# Patient Record
Sex: Male | Born: 1980 | Race: White | Hispanic: No | Marital: Married | Smoking: Former smoker
Health system: Southern US, Community
[De-identification: ages and names within clinical notes are randomized; demographics above are authoritative.]

## PROBLEM LIST (undated history)

## (undated) DIAGNOSIS — E669 Obesity, unspecified: Secondary | ICD-10-CM

## (undated) DIAGNOSIS — I4891 Unspecified atrial fibrillation: Secondary | ICD-10-CM

## (undated) DIAGNOSIS — R030 Elevated blood-pressure reading, without diagnosis of hypertension: Secondary | ICD-10-CM

## (undated) HISTORY — DX: Elevated blood-pressure reading, without diagnosis of hypertension: R03.0

## (undated) HISTORY — PX: ANTERIOR CRUCIATE LIGAMENT REPAIR: SHX115

## (undated) HISTORY — DX: Obesity, unspecified: E66.9

## (undated) HISTORY — PX: KNEE SURGERY: SHX244

## (undated) HISTORY — DX: Unspecified atrial fibrillation: I48.91

---

## 2014-05-27 ENCOUNTER — Ambulatory Visit: Payer: Self-pay | Admitting: Specialist

## 2014-06-16 ENCOUNTER — Encounter: Payer: Self-pay | Admitting: Specialist

## 2014-06-21 ENCOUNTER — Encounter
Admit: 2014-06-21 | Disposition: A | Discharge status: Home / Self Care | Payer: Self-pay | Attending: Specialist | Admitting: Specialist

## 2014-07-22 ENCOUNTER — Encounter
Admit: 2014-07-22 | Disposition: A | Discharge status: Home / Self Care | Payer: Self-pay | Attending: Specialist | Admitting: Specialist

## 2015-01-28 ENCOUNTER — Emergency Department
Admission: EM | Admit: 2015-01-28 | Discharge: 2015-01-28 | Disposition: A | Discharge status: Home / Self Care | Payer: No Typology Code available for payment source | Attending: Emergency Medicine | Admitting: Emergency Medicine

## 2015-01-28 ENCOUNTER — Emergency Department: Payer: No Typology Code available for payment source

## 2015-01-28 ENCOUNTER — Encounter: Payer: Self-pay | Admitting: Emergency Medicine

## 2015-01-28 DIAGNOSIS — S161XXA Strain of muscle, fascia and tendon at neck level, initial encounter: Secondary | ICD-10-CM | POA: Insufficient documentation

## 2015-01-28 DIAGNOSIS — S46812A Strain of other muscles, fascia and tendons at shoulder and upper arm level, left arm, initial encounter: Secondary | ICD-10-CM | POA: Insufficient documentation

## 2015-01-28 DIAGNOSIS — S199XXA Unspecified injury of neck, initial encounter: Secondary | ICD-10-CM | POA: Diagnosis present

## 2015-01-28 DIAGNOSIS — S39012A Strain of muscle, fascia and tendon of lower back, initial encounter: Secondary | ICD-10-CM | POA: Diagnosis not present

## 2015-01-28 DIAGNOSIS — Y9389 Activity, other specified: Secondary | ICD-10-CM | POA: Diagnosis not present

## 2015-01-28 DIAGNOSIS — Y998 Other external cause status: Secondary | ICD-10-CM | POA: Diagnosis not present

## 2015-01-28 DIAGNOSIS — Y92410 Unspecified street and highway as the place of occurrence of the external cause: Secondary | ICD-10-CM | POA: Diagnosis not present

## 2015-01-28 MED ORDER — IBUPROFEN 800 MG PO TABS: 800.0000 mg | ORAL_TABLET | Freq: Three times a day (TID) | ORAL | Status: DC | PRN

## 2015-01-28 MED ORDER — CYCLOBENZAPRINE HCL 10 MG PO TABS: 10.0000 mg | ORAL_TABLET | Freq: Three times a day (TID) | ORAL | Status: DC | PRN
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
## 2015-03-06 ENCOUNTER — Ambulatory Visit: Payer: Self-pay | Admitting: Physician Assistant

## 2015-03-06 ENCOUNTER — Encounter: Payer: Self-pay | Admitting: Physician Assistant

## 2015-03-06 VITALS — BP 147/94 | HR 81 | Temp 98.6°F

## 2015-03-06 DIAGNOSIS — J069 Acute upper respiratory infection, unspecified: Secondary | ICD-10-CM

## 2015-03-06 MED ORDER — AZITHROMYCIN 250 MG PO TABS: ORAL_TABLET | ORAL | Status: DC

## 2015-03-06 MED ORDER — ALBUTEROL SULFATE HFA 108 (90 BASE) MCG/ACT IN AERS: 2.0000 | INHALATION_SPRAY | Freq: Four times a day (QID) | RESPIRATORY_TRACT | Status: DC | PRN

## 2015-03-06 MED ORDER — FLUTICASONE PROPIONATE 50 MCG/ACT NA SUSP: 2.0000 | Freq: Every day | NASAL | Status: DC

## 2015-03-06 NOTE — Progress Notes (Signed)
S: C/o runny nose and congestion for 3 days, no fever, chills, cp/sob, v/d; mucus is yellow and thick, cough is sporadic, c/o of facial and dental pain.   Using otc meds:   O: PE: vitals wnl, nad, perrl eomi, normocephalic, tms dull, nasal mucosa red and swollen, throat injected, neck supple no lymph, lungs c t a, cv rrr, neuro intact  A:  Acute sinusitis   P: zpack, flonase, albuterol refill drink fluids, continue regular meds , use otc meds of choice, return if not improving in 5 days, return earlier if worsening

## 2015-04-16 ENCOUNTER — Emergency Department
Admission: EM | Admit: 2015-04-16 | Discharge: 2015-04-16 | Disposition: A | Discharge status: Home / Self Care | Payer: Worker's Compensation | Attending: Emergency Medicine | Admitting: Emergency Medicine

## 2015-04-16 ENCOUNTER — Emergency Department: Payer: Worker's Compensation

## 2015-04-16 ENCOUNTER — Encounter: Payer: Self-pay | Admitting: Emergency Medicine

## 2015-04-16 DIAGNOSIS — Y990 Civilian activity done for income or pay: Secondary | ICD-10-CM | POA: Insufficient documentation

## 2015-04-16 DIAGNOSIS — Z880 Allergy status to penicillin: Secondary | ICD-10-CM | POA: Insufficient documentation

## 2015-04-16 DIAGNOSIS — Z79899 Other long term (current) drug therapy: Secondary | ICD-10-CM | POA: Diagnosis not present

## 2015-04-16 DIAGNOSIS — S6991XA Unspecified injury of right wrist, hand and finger(s), initial encounter: Secondary | ICD-10-CM | POA: Diagnosis present

## 2015-04-16 DIAGNOSIS — Z792 Long term (current) use of antibiotics: Secondary | ICD-10-CM | POA: Diagnosis not present

## 2015-04-16 DIAGNOSIS — Y9389 Activity, other specified: Secondary | ICD-10-CM | POA: Diagnosis not present

## 2015-04-16 DIAGNOSIS — S62390A Other fracture of second metacarpal bone, right hand, initial encounter for closed fracture: Secondary | ICD-10-CM | POA: Diagnosis not present

## 2015-04-16 DIAGNOSIS — S62300A Unspecified fracture of second metacarpal bone, right hand, initial encounter for closed fracture: Secondary | ICD-10-CM

## 2015-04-16 DIAGNOSIS — Z87891 Personal history of nicotine dependence: Secondary | ICD-10-CM | POA: Insufficient documentation

## 2015-04-16 DIAGNOSIS — Y9289 Other specified places as the place of occurrence of the external cause: Secondary | ICD-10-CM | POA: Diagnosis not present

## 2015-04-16 MED ORDER — IBUPROFEN 800 MG PO TABS
800.0000 mg | ORAL_TABLET | Freq: Once | ORAL | Status: AC
  Administered 2015-04-16: 800 mg via ORAL
  Filled 2015-04-16: qty 1

## 2015-04-16 MED ORDER — DOCUSATE SODIUM 100 MG PO CAPS: ORAL_CAPSULE | ORAL | Status: DC

## 2015-04-16 MED ORDER — HYDROCODONE-ACETAMINOPHEN 5-325 MG PO TABS: 1.0000 | ORAL_TABLET | ORAL | Status: DC | PRN

## 2015-04-16 MED ORDER — ACETAMINOPHEN 500 MG PO TABS
1000.0000 mg | ORAL_TABLET | Freq: Once | ORAL | Status: AC
  Administered 2015-04-16: 1000 mg via ORAL
  Filled 2015-04-16: qty 2
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
## 2015-04-16 NOTE — Discharge Instructions (Signed)
Metacarpal Fracture °A metacarpal fracture is a break (fracture) of a bone in the hand. Metacarpals are the bones that extend from your knuckles to your wrist. In each hand, you have five metacarpal bones that connect your fingers and your thumb to your wrist. °Some hand fractures have bone pieces that are close together and stable (simple). These fractures may be treated with only a splint or cast. Hand fractures that have many pieces of broken bone (comminuted), unstable bone pieces (displaced), or a bone that breaks through the skin (compound) usually require surgery. °CAUSES °This injury may be caused by: °· A fall. °· A hard, direct hit to your hand. °· An injury that squeezes your knuckle, stretches your finger out of place, or crushes your hand. °RISK FACTORS °This injury is more likely to occur if: °· You play contact sports. °· You have certain bone diseases. °SYMPTOMS  °Symptoms of this type of fracture develop soon after the injury. Symptoms may include: °· Swelling. °· Pain. °· Stiffness. °· Increased pain with movement. °· Bruising. °· Inability to move a finger. °· A shortened finger. °· A finger knuckle that looks sunken in. °· Unusual appearance of the hand or finger (deformity). °DIAGNOSIS  °This injury may be diagnosed based on your signs and symptoms, especially if you had a recent hand injury. Your health care provider will perform a physical exam. He or she may also order X-rays to confirm the diagnosis.  °TREATMENT  °Treatment for this injury depends on the type of fracture you have and how severe it is. Possible treatments include: °· Non-reduction. This can be done if the bone does not need to be moved back into place. The fracture can be casted or splinted as it is.   °· Closed reduction. If your bone is stable and can be moved back into place, you may only need to wear a cast or splint or have buddy taping. °· Closed reduction with internal fixation (CRIF). This is the most common  treatment. You may have this procedure if your bone can be moved back into place but needs more support. Wires, pins, or screws may be inserted through your skin to stabilize the fracture. °· Open reduction with internal fixation (ORIF). This may be needed if your fracture is severe and unstable. It involves surgery to move your bone back into the right position. Screws, wires, or plates are used to stabilize the fracture. °After all procedures, you may need to wear a cast or a splint for several weeks. You will also need to have follow-up X-rays to make sure that the bone is healing well and staying in position. After you no longer need your cast or splint, you may need physical therapy. This will help you to regain full movement and strength in your hand.  °HOME CARE INSTRUCTIONS  °If You Have a Cast: °· Do not stick anything inside the cast to scratch your skin. Doing that increases your risk of infection. °· Check the skin around the cast every day. Report any concerns to your health care provider. You may put lotion on dry skin around the edges of the cast. Do not apply lotion to the skin underneath the cast. °If You Have a Splint: °· Wear it as directed by your health care provider. Remove it only as directed by your health care provider. °· Loosen the splint if your fingers become numb and tingle, or if they turn cold and blue. °Bathing °· Cover the cast or splint with a   watertight plastic bag to protect it from water while you take a bath or a shower. Do not let the cast or splint get wet. °Managing Pain, Stiffness, and Swelling °· If directed, apply ice to the injured area (if you have a splint, not a cast): °¨ Put ice in a plastic bag. °¨ Place a towel between your skin and the bag. °¨ Leave the ice on for 20 minutes, 2-3 times a day. °· Move your fingers often to avoid stiffness and to lessen swelling. °· Raise the injured area above the level of your heart while you are sitting or lying  down. °Driving °· Do not drive or operate heavy machinery while taking pain medicine. °· Do not drive while wearing a cast or splint on a hand that you use for driving. °Activity °· Return to your normal activities as directed by your health care provider. Ask your health care provider what activities are safe for you. °General Instructions °· Do not put pressure on any part of the cast or splint until it is fully hardened. This may take several hours. °· Keep the cast or splint clean and dry. °· Do not use any tobacco products, including cigarettes, chewing tobacco, or electronic cigarettes. Tobacco can delay bone healing. If you need help quitting, ask your health care provider. °· Take medicines only as directed by your health care provider. °· Keep all follow-up visits as directed by your health care provider. This is important. °SEEK MEDICAL CARE IF:  °· Your pain is getting worse. °· You have redness, swelling, or pain in the injured area.   °· You have fluid, blood, or pus coming from under your cast or splint.   °· You notice a bad smell coming from under your cast or splint.   °· You have a fever.   °SEEK IMMEDIATE MEDICAL CARE IF:  °· You develop a rash.   °· You have trouble breathing.   °· Your skin or nails on your injured hand turn blue or gray even after you loosen your splint. °· Your injured hand feels cold or becomes numb even after you loosen your splint.   °· You develop severe pain under the cast or in your hand. °  °This information is not intended to replace advice given to you by your health care provider. Make sure you discuss any questions you have with your health care provider. °  °Document Released: 04/08/2005 Document Revised: 12/28/2014 Document Reviewed: 01/26/2014 °Elsevier Interactive Patient Education ©2016 [employer] ° °Cast or Splint Care °Casts and splints support injured limbs and keep bones from moving while they heal. It is important to care for your cast or splint at  home.   °HOME CARE INSTRUCTIONS °· Keep the cast or splint uncovered during the drying period. It can take 24 to 48 hours to dry if it is made of plaster. A fiberglass cast will dry in less than 1 hour. °· Do not rest the cast on anything harder than a pillow for the first 24 hours. °· Do not put weight on your injured limb or apply pressure to the cast until your health care provider gives you permission. °· Keep the cast or splint dry. Wet casts or splints can lose their shape and may not support the limb as well. A wet cast that has lost its shape can also create harmful pressure on your skin when it dries. Also, wet skin can become infected. °¨ Cover the cast or splint with a plastic bag when bathing or when out in   the rain or snow. If the cast is on the trunk of the body, take sponge baths until the cast is removed. °¨ If your cast does become wet, dry it with a towel or a blow dryer on the cool setting only. °· Keep your cast or splint clean. Soiled casts may be wiped with a moistened cloth. °· Do not place any hard or soft foreign objects under your cast or splint, such as cotton, toilet paper, lotion, or powder. °· Do not try to scratch the skin under the cast with any object. The object could get stuck inside the cast. Also, scratching could lead to an infection. If itching is a problem, use a blow dryer on a cool setting to relieve discomfort. °· Do not trim or cut your cast or remove padding from inside of it. °· Exercise all joints next to the injury that are not immobilized by the cast or splint. For example, if you have a long leg cast, exercise the hip joint and toes. If you have an arm cast or splint, exercise the shoulder, elbow, thumb, and fingers. °· Elevate your injured arm or leg on 1 or 2 pillows for the first 1 to 3 days to decrease swelling and pain. It is best if you can comfortably elevate your cast so it is higher than your heart. °SEEK MEDICAL CARE IF:  °· Your cast or splint  cracks. °· Your cast or splint is too tight or too loose. °· You have unbearable itching inside the cast. °· Your cast becomes wet or develops a soft spot or area. °· You have a bad smell coming from inside your cast. °· You get an object stuck under your cast. °· Your skin around the cast becomes red or raw. °· You have new pain or worsening pain after the cast has been applied. °SEEK IMMEDIATE MEDICAL CARE IF:  °· You have fluid leaking through the cast. °· You are unable to move your fingers or toes. °· You have discolored (blue or white), cool, painful, or very swollen fingers or toes beyond the cast. °· You have tingling or numbness around the injured area. °· You have severe pain or pressure under the cast. °· You have any difficulty with your breathing or have shortness of breath. °· You have chest pain. °  °This information is not intended to replace advice given to you by your health care provider. Make sure you discuss any questions you have with your health care provider. °  °Document Released: 04/05/2000 Document Revised: 01/27/2013 Document Reviewed: 10/15/2012 °Elsevier Interactive Patient Education ©2016 [employer] ° °

## 2015-07-03 ENCOUNTER — Encounter (HOSPITAL_COMMUNITY): Payer: Self-pay | Admitting: Emergency Medicine

## 2015-07-03 ENCOUNTER — Emergency Department (HOSPITAL_COMMUNITY)
Admission: EM | Admit: 2015-07-03 | Discharge: 2015-07-03 | Disposition: A | Discharge status: Home / Self Care | Payer: Managed Care, Other (non HMO) | Source: Home / Self Care | Attending: Emergency Medicine | Admitting: Emergency Medicine

## 2015-07-03 DIAGNOSIS — L237 Allergic contact dermatitis due to plants, except food: Secondary | ICD-10-CM

## 2015-07-03 DIAGNOSIS — L03311 Cellulitis of abdominal wall: Secondary | ICD-10-CM | POA: Diagnosis not present

## 2015-07-03 MED ORDER — CEPHALEXIN 500 MG PO CAPS: 500.0000 mg | ORAL_CAPSULE | Freq: Four times a day (QID) | ORAL | Status: DC

## 2015-07-03 NOTE — ED Provider Notes (Signed)
CSN: [id number]     Arrival date & time 07/03/15  1911 History   First MD Initiated Contact with Patient 07/03/15 2013     Chief Complaint  Patient presents with  . Poison Ivy   (Consider location/radiation/quality/duration/timing/severity/associated sxs/prior Treatment) HPI  He is a 35 year old man here for evaluation of skin infection. He states he developed poison ivy in his left lower abdomen and groin area about a week ago. He has been using alcohol and over-the-counter cortisone cream and this has been improving. In the last day or 2 he has noticed 2 spots just superior to the poison ivy that have become more red and tender. He states it feels a little warm. He denies any drainage. He denies any known injury or bug bites. No fevers.  History reviewed. No pertinent past medical history. Past Surgical History  Procedure Laterality Date  . Knee surgery Left    History reviewed. No pertinent family history. Social History  Substance Use Topics  . Smoking status: Former Games developer  . Smokeless tobacco: Never Used  . Alcohol Use: Yes    Review of Systems As in history of present illness Allergies  Penicillin g  Home Medications   Prior to Admission medications   Medication Sig Start Date End Date Taking? Authorizing Provider  albuterol (PROAIR HFA) 108 (90 BASE) MCG/ACT inhaler Inhale 2 puffs into the lungs every 6 (six) hours as needed for wheezing or shortness of breath. 03/06/15   Faythe Ghee, PA-C  cephALEXin (KEFLEX) 500 MG capsule Take 1 capsule (500 mg total) by mouth 4 (four) times daily. 07/03/15   Charm Rings, MD  cyclobenzaprine (FLEXERIL) 10 MG tablet Take 1 tablet (10 mg total) by mouth every 8 (eight) hours as needed for muscle spasms. 01/28/15   Evangeline Dakin, PA-C  docusate sodium (COLACE) 100 MG capsule Take 1 tablet once or twice daily as needed for constipation while taking narcotic pain medicine 04/16/15   Loleta Rose, MD  fluticasone [facility]) 50 MCG/ACT  nasal spray Place 2 sprays into both nostrils daily. 03/06/15   Faythe Ghee, PA-C  HYDROcodone-acetaminophen (NORCO/VICODIN) 5-325 MG tablet Take 1-2 tablets by mouth every 4 (four) hours as needed for moderate pain. 04/16/15   Loleta Rose, MD  ibuprofen (ADVIL,MOTRIN) 800 MG tablet Take 1 tablet (800 mg total) by mouth every 8 (eight) hours as needed. 01/28/15   Evangeline Dakin, PA-C   Meds Ordered and Administered this Visit  Medications - No data to display  BP 156/92 mmHg  Pulse 72  Temp(Src) 98 F (36.7 C) (Oral)  Resp 18  SpO2 99% No data found.   Physical Exam  Constitutional: He is oriented to person, place, and time. He appears well-developed and well-nourished. No distress.  Cardiovascular: Normal rate.   Pulmonary/Chest: Effort normal.  Neurological: He is alert and oriented to person, place, and time.  Skin: Rash (he has a patch of healing papulovesicular rash on erythematous base in the left lower quadrant of the abdomen. Just superior to this he has 2 lesions that are tender with significant surrounding erythema. There are central nodules, but no fluctuance.) noted.    ED Course  Procedures (including critical care time)  Labs Review Labs Reviewed - No data to display  Imaging Review No results found.   MDM   1. Cellulitis of abdominal wall   2. Poison ivy dermatitis    Poison ivy appears to be resolving. Treat with Keflex for one week. Follow-up as  needed.    Charm RingsErin J Honig, MD 07/03/15 2033

## 2015-07-03 NOTE — Discharge Instructions (Signed)
Either several spots of poison ivy or bug bites have become infected. Take Keflex 4 times a day for 1 week. Use Tylenol or ibuprofen as needed for pain. Do warm compresses as needed. With the antibiotic, you should see improvement in 24-48 hours. Follow-up as needed.

## 2015-07-03 NOTE — ED Notes (Signed)
[facility] a [facility] ivy on his lower left abdomen that has been there for 1 week. The patient stated that yesterday he noticed two large sores on his left upper abdomen that are hot to the touch.

## 2015-07-18 ENCOUNTER — Encounter: Payer: Self-pay | Admitting: Physician Assistant

## 2015-07-18 ENCOUNTER — Encounter: Payer: Self-pay | Admitting: Emergency Medicine

## 2015-07-18 ENCOUNTER — Ambulatory Visit: Payer: Worker's Compensation | Admitting: Physician Assistant

## 2015-07-18 ENCOUNTER — Emergency Department
Admission: EM | Admit: 2015-07-18 | Discharge: 2015-07-18 | Disposition: A | Discharge status: Home / Self Care | Payer: Managed Care, Other (non HMO) | Attending: Emergency Medicine | Admitting: Emergency Medicine

## 2015-07-18 VITALS — BP 145/90 | HR 100 | Temp 98.5°F

## 2015-07-18 DIAGNOSIS — L03311 Cellulitis of abdominal wall: Secondary | ICD-10-CM | POA: Diagnosis present

## 2015-07-18 DIAGNOSIS — Z87891 Personal history of nicotine dependence: Secondary | ICD-10-CM | POA: Insufficient documentation

## 2015-07-18 DIAGNOSIS — L0390 Cellulitis, unspecified: Principal | ICD-10-CM

## 2015-07-18 DIAGNOSIS — L0291 Cutaneous abscess, unspecified: Secondary | ICD-10-CM

## 2015-07-18 MED ORDER — CLINDAMYCIN HCL 150 MG PO CAPS
450.0000 mg | ORAL_CAPSULE | Freq: Once | ORAL | Status: AC
  Administered 2015-07-18: 450 mg via ORAL
  Filled 2015-07-18: qty 3

## 2015-07-18 MED ORDER — DOXYCYCLINE HYCLATE 100 MG PO CAPS: 100.0000 mg | ORAL_CAPSULE | Freq: Two times a day (BID) | ORAL | Status: DC

## 2015-07-18 MED ORDER — DOXYCYCLINE HYCLATE 100 MG PO TABS
100.0000 mg | ORAL_TABLET | Freq: Once | ORAL | Status: DC
  Filled 2015-07-18: qty 1

## 2015-07-18 MED ORDER — CLINDAMYCIN HCL 150 MG PO CAPS: 450.0000 mg | ORAL_CAPSULE | Freq: Three times a day (TID) | ORAL | Status: DC

## 2015-07-18 NOTE — ED Provider Notes (Signed)
[facility] Emergency Department Provider Note  ____________________________________________  Time seen: 3:45 PM  I have reviewed the triage vital signs and the nursing notes.   HISTORY  Chief Complaint Cellulitis    HPI Sean Hahn is a 35 y.o. male who complains of pain in the left lower abdominal wall with redness. This started about 10 days ago after he had had a bad poison ivy dermatitis rash in the area. That dermatitis has improved, but then afterward he developed cellulitis. He completed a course of Keflex that was complicated by diffuse urticaria that seemed to help but did not completely resolve the rash. He then was started on Bactrim about 3 days ago, which she has been taking without effect. The rash seems to be rapidly worsening over the last 3 days despite taking the Bactrim.  No fevers chills nausea vomiting diarrhea dizziness or syncope.     History reviewed. No pertinent past medical history.   There are no active problems to display for this patient.    Past Surgical History  Procedure Laterality Date  . Knee surgery Left      Current Outpatient Rx  Name  Route  Sig  Dispense  Refill  . albuterol (PROAIR HFA) 108 (90 BASE) MCG/ACT inhaler   Inhalation   Inhale 2 puffs into the lungs every 6 (six) hours as needed for wheezing or shortness of breath.   1 Inhaler   6   . cephALEXin (KEFLEX) 500 MG capsule   Oral   Take 1 capsule (500 mg total) by mouth 4 (four) times daily.   28 capsule   0   . cyclobenzaprine (FLEXERIL) 10 MG tablet   Oral   Take 1 tablet (10 mg total) by mouth every 8 (eight) hours as needed for muscle spasms.   30 tablet   1   . docusate sodium (COLACE) 100 MG capsule      Take 1 tablet once or twice daily as needed for constipation while taking narcotic pain medicine   30 capsule   0   . doxycycline (VIBRAMYCIN) 100 MG capsule   Oral   Take 1 capsule (100 mg total) by mouth 2 (two) times  daily.   20 capsule   0   . fluticasone (FLONASE) 50 MCG/ACT nasal spray   Each Nare   Place 2 sprays into both nostrils daily.   16 g   6   . HYDROcodone-acetaminophen (NORCO/VICODIN) 5-325 MG tablet   Oral   Take 1-2 tablets by mouth every 4 (four) hours as needed for moderate pain.   30 tablet   0   . ibuprofen (ADVIL,MOTRIN) 800 MG tablet   Oral   Take 1 tablet (800 mg total) by mouth every 8 (eight) hours as needed.   30 tablet   0      Allergies Aloe; Keflex; and Penicillin g   No family history on file.  Social History Social History  Substance Use Topics  . Smoking status: Former Games developer  . Smokeless tobacco: Never Used  . Alcohol Use: Yes    Review of Systems  Constitutional:   No fever or chills. No weight changes Eyes:   No vision changes.  ENT:   No sore throat. No rhinorrhea. Cardiovascular:   No chest pain. Respiratory:   No dyspnea or cough. Gastrointestinal:   Left lower abdominal wall pain without any internal pain vomiting or diarrhea.  No BRBPR or melena. Genitourinary:   Negative for dysuria or  difficulty urinating. Musculoskeletal:   Negative for focal pain or swelling Skin:   Negative for rash. Neurological:   Negative for headaches, focal weakness or numbness.  10-point ROS otherwise negative.  ____________________________________________   PHYSICAL EXAM:  VITAL SIGNS: ED Triage Vitals  Enc Vitals Group     BP --      Pulse --      Resp --      Temp --      Temp src --      SpO2 --      Weight --      Height --      Head Cir --      Peak Flow --      Pain Score 07/18/15 1526 10     Pain Loc --      Pain Edu? --      Excl. in GC? --     Vital signs reviewed, nursing assessments reviewed.   Constitutional:   Alert and oriented. Well appearing and in no distress. Gastrointestinal:   Soft and nontender. Non distended. There is no CVA tenderness.  No rebound, rigidity, or guarding. No dominant pain with jumping up and  down except on the left lower abdominal wall near the skin. No inguinal lymphadenopathy Musculoskeletal:   Nontender with normal range of motion in all extremities. No joint effusions.  No lower extremity tenderness.  No edema. Neurologic:   Normal speech and language.  CN 2-10 normal. Motor grossly intact. No gross focal neurologic deficits are appreciated.  Skin:    There is a large erythematous area of the left lower abdominal wall extending from the midline laterally. It is not involving the umbilicus at this time. It is approximately 10 or 12 cm in width. Centrally there is an area of induration and tenderness and a small pustular eruption, with about total 3 cm of induration and 1 cm of pustule. Bedside Ultrasound examination of this area by me shows cobblestoning appearance of the soft tissues without any fluid collection to suggest an abscess. There is no drainage. The area is warm to the touch.  ____________________________________________    LABS (pertinent positives/negatives) (all labs ordered are listed, but only abnormal results are displayed) Labs Reviewed - No data to display ____________________________________________   EKG    ____________________________________________    RADIOLOGY    ____________________________________________   PROCEDURES   ____________________________________________   INITIAL IMPRESSION / ASSESSMENT AND PLAN / ED COURSE  Pertinent labs & imaging results that were available during my care of the patient were reviewed by me and considered in my medical decision making (see chart for details).  Patient presents with persistent abdominal wall cellulitis, currently uncomplicated. No abscess. No crepitus or evidence of necrotizing fasciitis. No involvement below the inguinal ligament. Offered patient a CT scan of the abdomen and pelvis to further delineate the extent of infectious involvement which he declines at this time. He overall  feels well but antibiotic choice is Bactrim seems to be ineffective. As this is most likely a strep infection that is not surprising. We'll start the patient on clindamycin and have him follow-up.     ____________________________________________   FINAL CLINICAL IMPRESSION(S) / ED DIAGNOSES  Final diagnoses:  Abdominal wall cellulitis      Sharman CheekPhillip Monque Haggar, MD 07/18/15 [phone]

## 2015-07-18 NOTE — Progress Notes (Signed)
S: c/o continuing cellulitis and abscess on abdomen, was treated with keflex and area got better, then it started getting worse and urgent care gave him septra/bactrim, has been on this for 3 days, now today area is more red and angry, is feeling weak and tired, no known fever, no drainage from area on abdomen  O: vitals wnl, nad, pt is a little pale compared to his norm, area on abdomen is extremely red/warm/tender, at least 12 inches by 6 inches in width, no drainage, n/v intact  A: worsening cellulitis/abscess, failed outpatient therapy  P: sent pt to ER, called and notified ER of pt's status

## 2015-07-18 NOTE — ED Notes (Signed)
Pt presents to ed with reports of left lower abd pain with possible cellulitis. Has been treated with keflex and bactrim. Was allergic to keflex and the bactrim has not helped.

## 2015-07-18 NOTE — Discharge Instructions (Signed)

## 2015-07-18 NOTE — ED Notes (Signed)
Pt denies fevers or cold chills.

## 2015-07-18 NOTE — ED Notes (Signed)
MD at bedside for d/c instuctions

## 2015-08-21 ENCOUNTER — Encounter: Payer: Self-pay | Admitting: Physician Assistant

## 2015-08-21 ENCOUNTER — Ambulatory Visit: Payer: Managed Care, Other (non HMO) | Admitting: Physician Assistant

## 2015-08-21 VITALS — BP 130/90 | HR 84 | Temp 97.9°F

## 2015-08-21 DIAGNOSIS — M62830 Muscle spasm of back: Secondary | ICD-10-CM

## 2015-08-21 DIAGNOSIS — L237 Allergic contact dermatitis due to plants, except food: Secondary | ICD-10-CM

## 2015-08-21 MED ORDER — DEXAMETHASONE SODIUM PHOSPHATE 100 MG/10ML IJ SOLN
10.0000 mg | Freq: Once | INTRAMUSCULAR | Status: AC
  Administered 2015-08-21: 10 mg via INTRAMUSCULAR

## 2015-08-21 MED ORDER — CYCLOBENZAPRINE HCL 10 MG PO TABS: 10.0000 mg | ORAL_TABLET | Freq: Three times a day (TID) | ORAL | Status: DC | PRN

## 2015-08-21 MED ORDER — PREDNISONE 20 MG PO TABS: 20.0000 mg | ORAL_TABLET | Freq: Every day | ORAL | Status: DC

## 2015-08-21 NOTE — Progress Notes (Signed)
S / generalised lesions secondary to exposure to poison ivy , arms, trunk, groin, legs Severe itching, also twisting motion lifting bag of fertilizer with left lumbar pain/spasm without red flags.  O/  VSS red papulovesicular lesions in crops  To arms , trunk, lower abd/groin upper thighs,  Left lumbar paraspinous musc tenderness , full ROM   A/ Contact derm  Muscle spasm lumbar   /Decadron 10 mg given IM x one in clinic Rx for pred taper 20 mg  Qd x 4, then 40 mg qd x 4 then 20 mg qd x 4 #24 o rf, rx flexeril 10 mg one po q 8 h prn #30 0 rf . Topical calamoseptine. Cut nails

## 2015-09-18 ENCOUNTER — Emergency Department
Admission: EM | Admit: 2015-09-18 | Discharge: 2015-09-18 | Disposition: A | Discharge status: Home / Self Care | Payer: Managed Care, Other (non HMO) | Attending: Emergency Medicine | Admitting: Emergency Medicine

## 2015-09-18 DIAGNOSIS — J45909 Unspecified asthma, uncomplicated: Secondary | ICD-10-CM | POA: Diagnosis not present

## 2015-09-18 DIAGNOSIS — Z87891 Personal history of nicotine dependence: Secondary | ICD-10-CM | POA: Diagnosis not present

## 2015-09-18 DIAGNOSIS — R1084 Generalized abdominal pain: Secondary | ICD-10-CM | POA: Insufficient documentation

## 2015-09-18 DIAGNOSIS — R112 Nausea with vomiting, unspecified: Secondary | ICD-10-CM | POA: Insufficient documentation

## 2015-09-18 DIAGNOSIS — R197 Diarrhea, unspecified: Secondary | ICD-10-CM | POA: Diagnosis not present

## 2015-09-18 DIAGNOSIS — I4891 Unspecified atrial fibrillation: Secondary | ICD-10-CM | POA: Insufficient documentation

## 2015-09-18 MED ORDER — SODIUM CHLORIDE 0.9 % IV BOLUS (SEPSIS)
1000.0000 mL | Freq: Once | INTRAVENOUS | Status: AC
Start: 2015-09-18 — End: 2015-09-18
  Administered 2015-09-18: 1000 mL via INTRAVENOUS

## 2015-09-18 MED ORDER — ONDANSETRON HCL 4 MG PO TABS: 4.0000 mg | ORAL_TABLET | Freq: Every day | ORAL | Status: DC | PRN

## 2015-09-18 MED ORDER — DICYCLOMINE HCL 10 MG PO CAPS
ORAL_CAPSULE | ORAL | Status: AC
  Administered 2015-09-18: 20 mg
  Filled 2015-09-18: qty 2

## 2015-09-18 MED ORDER — DICYCLOMINE HCL 20 MG PO TABS: 20.0000 mg | ORAL_TABLET | Freq: Three times a day (TID) | ORAL | Status: DC | PRN

## 2015-09-18 MED ORDER — DICYCLOMINE HCL 20 MG PO TABS: 20.0000 mg | ORAL_TABLET | Freq: Once | ORAL | Status: DC

## 2015-09-18 NOTE — ED Provider Notes (Signed)
[facility] Emergency Department Provider Note   ____________________________________________  Time seen: Seen upon arrival to the emergency department  I have reviewed the triage vital signs and the nursing notes.   HISTORY  Chief Complaint Emesis   HPI Sean Hahn is a 35 y.o. male with a history of hypertension who is presenting to the emergency department today with nausea, vomiting and diarrhea and abdominal cramping. 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 He said that soon after he began feeling weak and then one hour prior to arrival he began having nausea, vomiting and diarrhea. He has vomited once but has had 3 episodes of diarrhea. He says that there is not been any blood in the vomit or the diarrhea. Is complaining of diffuse abdominal cramping which is intermittently severe. No known sick contacts. Of note, his partner also ate the ice cream this afternoon and is having similar symptoms with a similar timeline of onset.   History reviewed. No pertinent past medical history.  There are no active problems to display for this patient.   Past Surgical History  Procedure Laterality Date  . Knee surgery Left     Current Outpatient Rx  Name  Route  Sig  Dispense  Refill  . cyclobenzaprine (FLEXERIL) 10 MG tablet   Oral   Take 1 tablet (10 mg total) by mouth every 8 (eight) hours as needed for muscle spasms.   30 tablet   0   . HYDROcodone-acetaminophen (NORCO/VICODIN) 5-325 MG tablet   Oral   Take 1-2 tablets by mouth every 4 (four) hours as needed for moderate pain.   30 tablet   0   . predniSONE (DELTASONE) 20 MG tablet   Oral   Take 1 tablet (20 mg total) by mouth daily with breakfast. Take 60 mg q am x4 d , 40 mg q am x 4 , 20 mg q am x 4   24 tablet   0     Allergies Aloe; Keflex; and Penicillin g  No family history on file.  Social  History Social History  Substance Use Topics  . Smoking status: Former Games developer  . Smokeless tobacco: Never Used  . Alcohol Use: 0.0 oz/week    0 Standard drinks or equivalent per week    Review of Systems Constitutional: No fever/chills Eyes: No visual changes. ENT: No sore throat. Cardiovascular: Denies chest pain. Respiratory: Denies shortness of breath. Gastrointestinal:  No constipation. Genitourinary: Negative for dysuria. Musculoskeletal: Negative for back pain. Skin: Negative for rash. Neurological: Negative for headaches, focal weakness or numbness.  10-point ROS otherwise negative.  ____________________________________________   PHYSICAL EXAM:  VITAL SIGNS: ED Triage Vitals  Enc Vitals Group     BP 09/18/15 1956 169/96 mmHg     Pulse Rate 09/18/15 1956 92     Resp 09/18/15 1956 16     Temp 09/18/15 1956 98.9 F (37.2 C)     Temp Source 09/18/15 1956 Oral     SpO2 09/18/15 1956 100 %     Weight 09/18/15 1929 260 lb (117.935 kg)     Height 09/18/15 1929  (1.778 m)     Head Cir --      Peak Flow --      Pain Score 09/18/15 1928 5     Pain Loc --      Pain Edu? --  Excl. in GC? --     Constitutional: Alert and oriented. Well appearing and in no acute distress. Eyes: Conjunctivae are normal. PERRL. EOMI. Head: Atraumatic. Nose: No congestion/rhinnorhea. Mouth/Throat: Mucous membranes are moist.   Neck: No stridor.   Cardiovascular: Normal rate, regular rhythm. Grossly normal heart sounds.   Respiratory: Normal respiratory effort.  No retractions. Lungs CTAB. Gastrointestinal: Soft With mild to moderate and diffuse abdominal tenderness. No rebound or guarding. No distention.  Musculoskeletal: No lower extremity tenderness nor edema.  No joint effusions. Neurologic:  Normal speech and language. No gross focal neurologic deficits are appreciated. No gait instability. Skin:  Skin is warm, dry and intact. No rash noted. Psychiatric: Mood and affect  are normal. Speech and behavior are normal.  ____________________________________________   LABS (all labs ordered are listed, but only abnormal results are displayed)  Labs Reviewed - No data to display ____________________________________________  EKG   ____________________________________________  RADIOLOGY   ____________________________________________   PROCEDURES   ____________________________________________   INITIAL IMPRESSION / ASSESSMENT AND PLAN / ED COURSE  Pertinent labs & imaging results that were available during my care of the patient were reviewed by me and considered in my medical decision making (see chart for details).  ----------------------------------------- 8:41 PM on 09/18/2015 -----------------------------------------  Patient without any distress at this time. Says that he is having reduced abdominal cramping but still with diffuse cramping. Nausea has subsided and able to tolerate by mouth Bentyl.  The synchronized timing of the onset of illness with his partner was for a similar symptoms but suspect that this is either food poisoning or viral illness. We discussed the lab workup but we both agree that the more reasonable course would be to rest at home and to see if the symptoms continue to resolve over time. Feel that the most likely etiology would be virus or food poisoning. We discussed return precautions such as worsening abdominal pain or worsening nausea or vomiting. The patient understands the plan and is willing to comply.  ____________________________________________   FINAL CLINICAL IMPRESSION(S) / ED DIAGNOSES  Abdominal pain with nausea vomiting and diarrhea.    NEW MEDICATIONS STARTED DURING THIS VISIT:  New Prescriptions   No medications on file     Note:  This document was prepared using Dragon voice recognition software and may include unintentional dictation errors.    Myrna Blazeravid Cordarrel Schaevitz, MD 09/18/15 [phone]

## 2015-09-18 NOTE — ED Notes (Signed)
Severe abd cramping with nausea/vomiting/diarrhea for the last few hours - pt took zofran 8mg  without complete relief at this time - symptoms occurred after eating at local ice cream shop

## 2015-09-18 NOTE — ED Notes (Signed)
Ate at ice cream shop and then within a few hours began having nausea/vomiting/diarrhea and severe abd cramps - pt arrived via ems

## 2015-09-18 NOTE — Discharge Instructions (Signed)
Abdominal Pain, Adult °Many things can cause abdominal pain. Usually, abdominal pain is not caused by a disease and will improve without treatment. It can often be observed and treated at home. Your health care provider will do a physical exam and possibly order blood tests and X-rays to help determine the seriousness of your pain. However, in many cases, more time must pass before a clear cause of the pain can be found. Before that point, your health care provider may not know if you need more testing or further treatment. °HOME CARE INSTRUCTIONS °Monitor your abdominal pain for any changes. The following actions may help to alleviate any discomfort you are experiencing: °· Only take over-the-counter or prescription medicines as directed by your health care provider. °· Do not take laxatives unless directed to do so by your health care provider. °· Try a clear liquid diet (broth, tea, or water) as directed by your health care provider. Slowly move to a bland diet as tolerated. °SEEK MEDICAL CARE IF: °· You have unexplained abdominal pain. °· You have abdominal pain associated with nausea or diarrhea. °· You have pain when you urinate or have a bowel movement. °· You experience abdominal pain that wakes you in the night. °· You have abdominal pain that is worsened or improved by eating food. °· You have abdominal pain that is worsened with eating fatty foods. °· You have a fever. °SEEK IMMEDIATE MEDICAL CARE IF: °· Your pain does not go away within 2 hours. °· You keep throwing up (vomiting). °· Your pain is felt only in portions of the abdomen, such as the right side or the left lower portion of the abdomen. °· You pass bloody or black tarry stools. °MAKE SURE YOU: °· Understand these instructions. °· Will watch your condition. °· Will get help right away if you are not doing well or get worse. °  °This information is not intended to replace advice given to you by your health care provider. Make sure you discuss  any questions you have with your health care provider. °  °Document Released: 01/16/2005 Document Revised: 12/28/2014 Document Reviewed: 12/16/2012 °Elsevier Interactive Patient Education ©2016 Elsevier Inc. ° °Diarrhea °Diarrhea is watery poop (stool). It can make you feel weak, tired, thirsty, or give you a dry mouth (signs of dehydration). Watery poop is a sign of another problem, most often an infection. It often lasts 2-3 days. It can last longer if it is a sign of something serious. Take care of yourself as told by your doctor. °HOME CARE  °· Drink 1 cup (8 ounces) of fluid each time you have watery poop. °· Do not drink the following fluids: °¨ Those that contain simple sugars (fructose, glucose, galactose, lactose, sucrose, maltose). °¨ Sports drinks. °¨ Fruit juices. °¨ Whole milk products. °¨ Sodas. °¨ Drinks with caffeine (coffee, tea, soda) or alcohol. °· Oral rehydration solution may be used if the doctor says it is okay. You may make your own solution. Follow this recipe: °¨  - teaspoon table salt. °¨ ¾ teaspoon baking soda. °¨  teaspoon salt substitute containing potassium chloride. °¨ 1 tablespoons sugar. °¨ 1 liter (34 ounces) of water. °· Avoid the following foods: °¨ High fiber foods, such as raw fruits and vegetables. °¨ Nuts, seeds, and whole grain breads and cereals. °¨  Those that are sweetened with sugar alcohols (xylitol, sorbitol, mannitol). °· Try eating the following foods: °¨ Starchy foods, such as rice, toast, pasta, low-sugar cereal, oatmeal, baked potatoes, crackers, and   bagels. °¨ Bananas. °¨ Applesauce. °· Eat probiotic-rich foods, such as yogurt and milk products that are fermented. °· Wash your hands well after each time you have watery poop. °· Only take medicine as told by your doctor. °· Take a warm bath to help lessen burning or pain from having watery poop. °GET HELP RIGHT AWAY IF:  °· You cannot drink fluids without throwing up (vomiting). °· You keep throwing up. °· You  have blood in your poop, or your poop looks black and tarry. °· You do not pee (urinate) in 6-8 hours, or there is only a small amount of very dark pee. °· You have belly (abdominal) pain that gets worse or stays in the same spot (localizes). °· You are weak, dizzy, confused, or light-headed. °· You have a very bad headache. °· Your watery poop gets worse or does not get better. °· You have a fever or lasting symptoms for more than 2-3 days. °· You have a fever and your symptoms suddenly get worse. °MAKE SURE YOU:  °· Understand these instructions. °· Will watch your condition. °· Will get help right away if you are not doing well or get worse. °  °This information is not intended to replace advice given to you by your health care provider. Make sure you discuss any questions you have with your health care provider. °  °Document Released: 09/25/2007 Document Revised: 04/29/2014 Document Reviewed: 12/15/2011 °Elsevier Interactive Patient Education ©2016 Elsevier Inc. ° °Nausea and Vomiting °Nausea means you feel sick to your stomach. Throwing up (vomiting) is a reflex where stomach contents come out of your mouth. °HOME CARE  °· Take medicine as told by your doctor. °· Do not force yourself to eat. However, you do need to drink fluids. °· If you feel like eating, eat a normal diet as told by your doctor. °¨ Eat rice, wheat, potatoes, bread, lean meats, yogurt, fruits, and vegetables. °¨ Avoid high-fat foods. °· Drink enough fluids to keep your pee (urine) clear or pale yellow. °· Ask your doctor how to replace body fluid losses (rehydrate). Signs of body fluid loss (dehydration) include: °¨ Feeling very thirsty. °¨ Dry lips and mouth. °¨ Feeling dizzy. °¨ Dark pee. °¨ Peeing less than normal. °¨ Feeling confused. °¨ Fast breathing or heart rate. °GET HELP RIGHT AWAY IF:  °· You have blood in your throw up. °· You have black or bloody poop (stool). °· You have a bad headache or stiff neck. °· You feel confused. °· You  have bad belly (abdominal) pain. °· You have chest pain or trouble breathing. °· You do not pee at least once every 8 hours. °· You have cold, clammy skin. °· You keep throwing up after 24 to 48 hours. °· You have a fever. °MAKE SURE YOU:  °· Understand these instructions. °· Will watch your condition. °· Will get help right away if you are not doing well or get worse. °  °This information is not intended to replace advice given to you by your health care provider. Make sure you discuss any questions you have with your health care provider. °  °Document Released: 09/25/2007 Document Revised: 07/01/2011 Document Reviewed: 09/07/2010 °Elsevier Interactive Patient Education ©2016 Elsevier Inc. ° °

## 2015-09-19 ENCOUNTER — Ambulatory Visit: Payer: Managed Care, Other (non HMO) | Admitting: Family Medicine

## 2015-09-20 ENCOUNTER — Ambulatory Visit: Payer: Self-pay | Admitting: Family Medicine

## 2015-09-21 ENCOUNTER — Encounter: Payer: Self-pay | Admitting: Family Medicine

## 2015-09-21 ENCOUNTER — Ambulatory Visit (INDEPENDENT_AMBULATORY_CARE_PROVIDER_SITE_OTHER): Payer: Managed Care, Other (non HMO) | Admitting: Family Medicine

## 2015-09-21 VITALS — BP 133/86 | HR 87 | Ht 75.5 in | Wt 271.0 lb

## 2015-09-21 DIAGNOSIS — I480 Paroxysmal atrial fibrillation: Secondary | ICD-10-CM

## 2015-09-21 DIAGNOSIS — R030 Elevated blood-pressure reading, without diagnosis of hypertension: Secondary | ICD-10-CM | POA: Diagnosis not present

## 2015-09-21 DIAGNOSIS — E669 Obesity, unspecified: Secondary | ICD-10-CM

## 2015-09-21 HISTORY — DX: Elevated blood-pressure reading, without diagnosis of hypertension: R03.0

## 2015-09-21 NOTE — Progress Notes (Signed)
Carlye Grippeeborah J Eira Alpert, D.O. Family Medicine Physician [facility]     Subjective:    CC: New pt, here to establish care.   HPI: Sean Hahn is a pleasant 35 y.o. male who [facility] today to establish care.   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   Here to establish and discuss his PMHx with me.   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  Eats a lot of fast foods while working for EMS and with a newborn at home, he has been neglecting himself/ his health for a couple of yrs now.     Has had blood pressure problems ever since his mid 6920s. Has never been on medication for this however, and usually runs in the high 130's / 80's.  NO txmnt regimen, doesn't know what DASH diet is.  No chest pain, shortness of breath, headache, visual changes, or peripheral edema  Had 2 bouts of A.-fib several years ago that lasted only a couple minutes, and knows that's what it was b/c he captured it on ekg while he was working at EMS.  Saw Cards doc- was on monitor for 24hrs w/o capture and told no need for txmnt/ f/up.  Pt never had sx with these episodes.  None in many yrs.   Has h/o back pain- chronic- flairs up from time to time. Uses flexeril prn and vicoden seldomly.    No other medical issues/ concerns today to discuss       Past Medical History  Diagnosis Date  . Atrial fibrillation (HCC)   . Pre-hypertension 09/21/2015  . Obese 09/24/2015    Past Surgical History  Procedure Laterality Date  . Knee surgery Left   . Anterior cruciate ligament repair Left     Family History  Problem Relation Age of Onset  . Hypertension Mother   . Hypertension Father     History  Drug Use No  ,  History  Alcohol Use  . 3.0 - 6.0 oz/week  . 0 Standard drinks or equivalent, 5-10 Cans of beer per week  ,  History   Smoking status  . Former Smoker  . Quit date: 04/22/2005  Smokeless tobacco  . Never Used  ,  History  Sexual Activity  . Sexual Activity: Yes  . Birth Control/ Protection: None    Patient's Medications  New Prescriptions   ALLOPURINOL (ZYLOPRIM) 300 MG TABLET    Take 1 tablet (300 mg total) by mouth daily.   CIPROFLOXACIN (CIPRO) 500 MG TABLET    Take 1 tablet (500 mg total) by mouth 2 (two) times daily.   METRONIDAZOLE (FLAGYL) 500 MG TABLET    Take 1 tablet (500 mg total) by mouth 3 (three) times daily.  Previous Medications   CYCLOBENZAPRINE (FLEXERIL) 10 MG TABLET    Take 1 tablet (10 mg total) by mouth every 8 (eight) hours as needed for muscle spasms.   HYDROCODONE-ACETAMINOPHEN (NORCO/VICODIN) 5-325 MG TABLET    Take 1-2 tablets by mouth every 4 (four) hours as needed for moderate pain.   ONDANSETRON (ZOFRAN-ODT) 4 MG DISINTEGRATING TABLET    Take 4 mg by mouth every 8 (eight) hours as needed for nausea or vomiting.  Modified Medications   No medications on file  Discontinued Medications   DICYCLOMINE (BENTYL) 20 MG TABLET    Take 1  tablet (20 mg total) by mouth 3 (three) times daily as needed for spasms.   ONDANSETRON (ZOFRAN) 4 MG TABLET    Take 1 tablet (4 mg total) by mouth daily as needed.   PREDNISONE (DELTASONE) 20 MG TABLET    Take 1 tablet (20 mg total) by mouth daily with breakfast. Take 60 mg q am x4 d , 40 mg q am x 4 , 20 mg q am x 4    ALLERGIES: Aloe; Keflex; and Penicillin g   Review of Systems: Full 14 point ROS performed via "adult medical history form".  Negative except for noted above    Objective:   Blood pressure 133/86, pulse 87, height 6' 3.5" (1.918 m), weight 271 lb (122.925 kg). Body mass index is 33.42 kg/(m^2).  General: Well Developed, well nourished, and in no acute distress.  Neuro: Alert and oriented x3, extra-ocular muscles intact, sensation grossly intact.  HEENT: Normocephalic, atraumatic, pupils equal round reactive to light,  neck supple, no gross masses, no carotid bruits, no JVD apprec Skin: no gross suspicious lesions or rashes  Cardiac: Regular rate and rhythm, no murmurs rubs or gallops.  Respiratory: Essentially clear to auscultation bilaterally. Not using accessory muscles, speaking in full sentences.  Abdominal: Soft, not grossly distended Musculoskeletal: Ambulates w/o diff, FROM * 4 ext.  Vasc: less 2 sec cap RF, warm and pink  Psych:  No HI/SI, judgement and insight good.    Impression and Recommendations:    The patient was counselled, risk factors were discussed, anticipatory guidance given.  Pt was seen in the office today for 25+ minutes, with over 50% time spent in face the face counseling and coordination of care.  Told pt to make appt once yrly for CPE  Pre-hypertension Dash, lifestyle mod d/c pt in detail.    Atrial fibrillation (HCC) No problem for many yrs. Not Active.   Obese Extensive counseling- lifesytle modifications; dietary and exercise    Note: This document was prepared using Dragon voice recognition software and may include unintentional dictation errors.

## 2015-09-21 NOTE — Patient Instructions (Addendum)
- make appt for fastign bloodwork near future.  CBC, CMP, TSH, A1c, FLP, Vit D.     Guidelines for a Low Sodium Diet   Low Sodium Diet A main source of sodium is table salt. The average American eats five or more teaspoons of salt each day. This is about 20 times as much as the body needs. In fact, your body needs only 1/4 teaspoon of salt every day. Sodium is found naturally in foods, but a lot of it is added during processing and preparation. Many foods that do not taste salty may still be high in sodium. Large amounts of sodium can be hidden in canned, processed and convenience foods. And sodium can be found in many foods that are served at [employer].  Sodium controls fluid balance in our bodies and maintains blood volume and blood pressure. Eating too much sodium may raise blood pressure and cause fluid retention, which could lead to swelling of the legs and feet or other health issues.  When limiting sodium in your diet, a common target is to eat less than 2,000 milligrams of sodium per day.   General Guidelines for Cutting Down on Salt Eliminate salty foods from your diet and reduce the amount of salt used in cooking. Sea salt is no better than regular salt.  Choose low sodium foods. Many salt-free or reduced salt products are available. When reading food labels, low sodium is defined as 140 mg of sodium per serving.  Salt substitutes are sometimes made from potassium, so read the label. If you are on a low potassium diet, then check with your doctor before using those salt substitutes.  Be creative and season your foods with spices, herbs, lemon, garlic, ginger, vinegar and pepper. Remove the salt shaker from the table.  Read ingredient labels to identify foods high in sodium. Items with 400 mg or more of sodium are high in sodium. High sodium food additives include salt, brine, or other items that say sodium, such as monosodium glutamate.  Eat more home-cooked meals.  Foods cooked from scratch are naturally lower in sodium than most instant and boxed mixes.  Don't use softened water for cooking and drinking since it contains added salt.  Avoid medications which contain sodium such as [place] Tour manager.  For more information; food composition books are available which tell how much sodium is in food. Online sources such as www.calorieking.com also list amounts.     Meats, Poultry, Fish, Legumes, Eggs and Nuts  High-Sodium Foods: Smoked, cured, salted or canned meat, fish or poultry including bacon, cold cuts, ham, frankfurters, sausage, sardines, caviar and anchovies Frozen breaded meats and dinners, such as burritos and pizza Canned entrees, such as ravioli, spam and chili Salted nuts Beans canned with salt added  Low-Sodium Alternatives: Any fresh or frozen beef, lamb, pork, poultry and fish Eggs and egg substitutes Low-sodium peanut butter Dry peas and beans (not canned) Low-sodium canned fish Drained, water or oil packed canned fish or poultry   Dairy Products  High-Sodium Foods: Buttermilk Regular and processed cheese, cheese spreads and sauces Cottage cheese  Low-Sodium Alternatives: Milk, yogurt, ice cream and ice milk Low-sodium cheeses, cream cheese, ricotta cheese and mozzarella   Breads, Grains and Cereals  High-Sodium Foods: Bread and rolls with salted tops Quick breads, self-rising flour, biscuit, pancake and waffle mixes Pizza, croutons and salted crackers Prepackaged, processed mixes for potatoes, rice, pasta and stuffing  Low-Sodium Alternatives: Breads, bagels and rolls without salted tops  Muffins and most ready-to-eat cereals All rice and pasta, but do not to add salt when cooking Low-sodium corn and flour tortillas and noodles Low-sodium crackers and breadsticks Unsalted popcorn, chips and pretzels      Vegetables and Fruits  High-Sodium Foods: Regular canned vegetables and vegetable  juices Olives, pickles, sauerkraut and other pickled vegetables Vegetables made with ham, bacon or salted pork Packaged mixes, such as scalloped or au gratin potatoes, frozen hash browns and Tater Tots Commercially prepared pasta and tomato sauces and salsa  Low-Sodium Alternatives: Fresh and frozen vegetables without sauces Low-sodium canned vegetables, sauces and juices Fresh potatoes, frozen [place] fries and instant mashed potatoes Low-salt tomato or V-8 juice. Most fresh, frozen and canned fruit Dried fruits   Soups  High-Sodium Foods: Regular canned and dehydrated soup, broth and bouillon Cup of noodles and seasoned ramen mixes  Low-Sodium Alternatives: Low-sodium canned and dehydrated soups, broth and bouillon Homemade soups without added salt   Fats, Desserts and Sweets  High-Sodium Foods: Soy sauce, seasoning salt, other sauces and marinades Bottled salad dressings, regular salad dressing with bacon bits Salted butter or margarine Instant pudding and cake Large portions of ketchup, mustard  Low-Sodium Alternatives: Vinegar, unsalted butter or margarine Vegetable oils and low sodium sauces and salad dressings Mayonnaise All desserts made without salt           Top Ten Foods for Health  1. Water Drink at least 8 to 12 cups of water daily. Consume half of your body weight in pounds, is the amount of water in ounces to drink daily.  Ie: a 200lb person = 100 oz water daily  2. Dark Green Vegetables Eat dark green vegetables at least three to four times a week. Good options include broccoli, peppers, brussel sprouts and leafy greens like kale and spinach.  3. Whole Grains Whole grains should be included in your diet at least two to three times daily. Look for whole wheat flour, rye, oatmeal, barley, amaranth, quinoa or a multigrain. A good source of fiber includes 3 to 4 grams of fiber per serving. A great source has 5 or more grams of fiber per  serving.  4. Beans and Lentils Try to eat a bean-based meal at least once a week. Try to add legumes, including beans and lentils, to soups, stews, casseroles, salads and dips or eat them plain.  5. Fish Try to eat two to three serving of fish a week. A serving consists of 3 to 4 ounces of cooked fish. Good choices are salmon, trout, herring, bluefish, sardines and tuna.  6. Berries Include two to four servings of fruit in your diet each day. Try to eat berries such as raspberries, blueberries, blackberries and strawberries.  7. Winter Squash Eat butternut and acorn squash as well as other richly pigmented dark orange and green colored vegetables like sweet potato, cantaloupe and mango.  8. Soy 25 grams of soy protein a day is recommended as part of a low-fat diet to help lower cholesterol levels. Try tofu, soymilk, edamame soybeans, tempeh and texturized vegetable protein (TVP).  9. Flaxseed, Nuts and Seeds Add 1 to 2 tablespoons of ground flaxseed or other seeds to food each day or include a moderate amount of nuts - 1/4 cup - in your daily diet.  10. Organic Yogurt Men and women between 75 and 15 years of age need 1000 milligrams of calcium a day and 1200 milligrams if 50 or older. Eat calcium-rich foods such as nonfat or low-fat  dairy products three to four times a day. Include organic choices.          DASH Eating Plan DASH stands for "Dietary Approaches to Stop Hypertension." The DASH eating plan is a healthy eating plan that has been shown to reduce high blood pressure (hypertension). Additional health benefits may include reducing the risk of type 2 diabetes mellitus, heart disease, and stroke. The DASH eating plan may also help with weight loss. WHAT DO I NEED TO KNOW ABOUT THE DASH EATING PLAN? For the DASH eating plan, you will follow these general guidelines:  Choose foods with a percent daily value for sodium of less than 5% (as listed on the food label).  Use  salt-free seasonings or herbs instead of table salt or sea salt.  Check with your health care provider or pharmacist before using salt substitutes.  Eat lower-sodium products, often labeled as "lower sodium" or "no salt added."  Eat fresh foods.  Eat more vegetables, fruits, and low-fat dairy products.  Choose whole grains. Look for the word "whole" as the first word in the ingredient list.  Choose fish and skinless chicken or [place] more often than red meat. Limit fish, poultry, and meat to 6 oz (170 g) each day.  Limit sweets, desserts, sugars, and sugary drinks.  Choose heart-healthy fats.  Limit cheese to 1 oz (28 g) per day.  Eat more home-cooked food and less restaurant, buffet, and fast food.  Limit fried foods.  Cook foods using methods other than frying.  Limit canned vegetables. If you do use them, rinse them well to decrease the sodium.  When eating at a restaurant, ask that your food be prepared with less salt, or no salt if possible. WHAT FOODS CAN I EAT? Seek help from a dietitian for individual calorie needs. Grains Whole grain or whole wheat bread. Brown rice. Whole grain or whole wheat pasta. Quinoa, bulgur, and whole grain cereals. Low-sodium cereals. Corn or whole wheat flour tortillas. Whole grain cornbread. Whole grain crackers. Low-sodium crackers. Vegetables Fresh or frozen vegetables (raw, steamed, roasted, or grilled). Low-sodium or reduced-sodium tomato and vegetable juices. Low-sodium or reduced-sodium tomato sauce and paste. Low-sodium or reduced-sodium canned vegetables.  Fruits All fresh, canned (in natural juice), or frozen fruits. Meat and Other Protein Products Ground beef (85% or leaner), grass-fed beef, or beef trimmed of fat. Skinless chicken or [place]. Ground chicken or [place]. Pork trimmed of fat. All fish and seafood. Eggs. Dried beans, peas, or lentils. Unsalted nuts and seeds. Unsalted canned beans. Dairy Low-fat dairy products, such as  skim or 1% milk, 2% or reduced-fat cheeses, low-fat ricotta or cottage cheese, or plain low-fat yogurt. Low-sodium or reduced-sodium cheeses. Fats and Oils Tub margarines without trans fats. Light or reduced-fat mayonnaise and salad dressings (reduced sodium). Avocado. Safflower, olive, or canola oils. Natural peanut or almond butter. Other Unsalted popcorn and pretzels. The items listed above may not be a complete list of recommended foods or beverages. Contact your dietitian for more options. WHAT FOODS ARE NOT RECOMMENDED? Grains White bread. White pasta. White rice. Refined cornbread. Bagels and croissants. Crackers that contain trans fat. Vegetables Creamed or fried vegetables. Vegetables in a cheese sauce. Regular canned vegetables. Regular canned tomato sauce and paste. Regular tomato and vegetable juices. Fruits Dried fruits. Canned fruit in light or heavy syrup. Fruit juice. Meat and Other Protein Products Fatty cuts of meat. Ribs, chicken wings, bacon, sausage, bologna, salami, chitterlings, fatback, hot dogs, bratwurst, and packaged luncheon meats. Salted nuts  and seeds. Canned beans with salt. Dairy Whole or 2% milk, cream, half-and-half, and cream cheese. Whole-fat or sweetened yogurt. Full-fat cheeses or blue cheese. Nondairy creamers and whipped toppings. Processed cheese, cheese spreads, or cheese curds. Condiments Onion and garlic salt, seasoned salt, table salt, and sea salt. Canned and packaged gravies. Worcestershire sauce. Tartar sauce. Barbecue sauce. Teriyaki sauce. Soy sauce, including reduced sodium. Steak sauce. Fish sauce. Oyster sauce. Cocktail sauce. Horseradish. Ketchup and mustard. Meat flavorings and tenderizers. Bouillon cubes. Hot sauce. Tabasco sauce. Marinades. Taco seasonings. Relishes. Fats and Oils Butter, stick margarine, lard, shortening, ghee, and bacon fat. Coconut, palm kernel, or palm oils. Regular salad dressings. Other Pickles and olives. Salted  popcorn and pretzels. The items listed above may not be a complete list of foods and beverages to avoid. Contact your dietitian for more information. WHERE CAN I FIND MORE INFORMATION? National Heart, Lung, and Blood Institute: [employer]   This information is not intended to replace advice given to you by your health care provider. Make sure you discuss any questions you have with your health care provider.   Document Released: 03/28/2011 Document Revised: 04/29/2014 Document Reviewed: 02/10/2013 Elsevier Interactive Patient Education [employer]

## 2015-09-22 ENCOUNTER — Ambulatory Visit: Payer: Managed Care, Other (non HMO) | Admitting: Physician Assistant

## 2015-09-22 ENCOUNTER — Encounter: Payer: Self-pay | Admitting: Physician Assistant

## 2015-09-22 VITALS — BP 120/80 | HR 88 | Temp 98.9°F

## 2015-09-22 DIAGNOSIS — A059 Bacterial foodborne intoxication, unspecified: Secondary | ICD-10-CM

## 2015-09-22 MED ORDER — CIPROFLOXACIN HCL 500 MG PO TABS: 500.0000 mg | ORAL_TABLET | Freq: Two times a day (BID) | ORAL | Status: DC

## 2015-09-22 MED ORDER — ALLOPURINOL 300 MG PO TABS: 300.0000 mg | ORAL_TABLET | Freq: Every day | ORAL | Status: DC

## 2015-09-22 MED ORDER — METRONIDAZOLE 500 MG PO TABS: 500.0000 mg | ORAL_TABLET | Freq: Three times a day (TID) | ORAL | Status: DC

## 2015-09-22 NOTE — Progress Notes (Signed)
S:  Pt c/o vomiting and diarrhea, sx for 6 day, some fever/chills,  abd pain is cramping with diarrhea; now is sore along both lower quads, diarrhea is very foul smelling, states he ate oysters on Saturday, then on Sunday had ice cream and so did his partner, both started with vom/diarrhea afterwards, states he went to the ER they gave him fluids and zofran, told him it was viral or food poisoning, feels dehydrated, states has not had vomiting since Tues but still has diarrhea, also some problems with gout since dehydrated, denies cp/sob, denies camping, recent antibiotics, or exposure to bad water Remainder ros neg  O:  Vitals wnl, nad, ENT wnl, neck supple no lymph, lungs c t a, cv rrr, abd soft tender in both lower quads, bs normal all 4 quads,  neuro intact  A:  Viral gastroenteritis vs food poisoning,   P:  cipro and flagyll, allopurinol for gout, do not want pt to take cholchicine due to stomach problems;  Reassurance, fluids, brat diet, immodium ad for diarrhea if needed,  return if not better in 3 days, return earlier if worsening, pt to go to ER if abd pain is worsening

## 2015-09-24 ENCOUNTER — Encounter: Payer: Self-pay | Admitting: Family Medicine

## 2015-09-24 DIAGNOSIS — E669 Obesity, unspecified: Secondary | ICD-10-CM

## 2015-09-24 DIAGNOSIS — I4891 Unspecified atrial fibrillation: Secondary | ICD-10-CM | POA: Insufficient documentation

## 2015-09-24 HISTORY — DX: Obesity, unspecified: E66.9

## 2015-09-24 NOTE — Assessment & Plan Note (Signed)
Extensive counseling- lifesytle modifications; dietary and exercise

## 2015-09-24 NOTE — Assessment & Plan Note (Signed)
No problem for many yrs. Not Active.

## 2015-09-24 NOTE — Assessment & Plan Note (Signed)
Dash, lifestyle mod d/c pt in detail.

## 2015-09-25 NOTE — Progress Notes (Signed)
Pt left a message and stated he has had a decreased appetite since being seen in the clinic, was started on flagyl and cipro for ?food poisoning  Called pt, told him flagyl causes anorexia, pt states he is forcing himself to eat, the diarrhea has improved, isn't watery but is still soft, states its improving just worried because he has lost weight while being sick  Reassured pt, if sx persist after finishing medication then we will refer to specialist

## 2016-01-12 ENCOUNTER — Telehealth: Payer: Self-pay | Admitting: Emergency Medicine

## 2016-01-12 MED ORDER — PREDNISONE 10 MG (48) PO TBPK: ORAL_TABLET | Freq: Every day | ORAL | 0 refills | Status: DC

## 2016-01-12 NOTE — Telephone Encounter (Signed)
Pt called office and has gotten poison ivy again, states the last time he got poison ivy he ended up with cellulitis   Sent rx for sterapred ds 10mg  12 d dose pack to [facility]
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
## 2016-05-22 ENCOUNTER — Ambulatory Visit: Payer: Self-pay | Admitting: Family

## 2016-05-22 VITALS — BP 110/70 | HR 61 | Temp 97.7°F

## 2016-05-22 DIAGNOSIS — J4520 Mild intermittent asthma, uncomplicated: Secondary | ICD-10-CM

## 2016-05-22 DIAGNOSIS — J0110 Acute frontal sinusitis, unspecified: Secondary | ICD-10-CM

## 2016-05-22 DIAGNOSIS — M62830 Muscle spasm of back: Secondary | ICD-10-CM

## 2016-05-22 DIAGNOSIS — J0100 Acute maxillary sinusitis, unspecified: Secondary | ICD-10-CM

## 2016-05-22 MED ORDER — CYCLOBENZAPRINE HCL 10 MG PO TABS: 10.0000 mg | ORAL_TABLET | Freq: Three times a day (TID) | ORAL | 0 refills | Status: DC | PRN

## 2016-05-22 MED ORDER — AZITHROMYCIN 250 MG PO TABS: ORAL_TABLET | ORAL | 0 refills | Status: DC

## 2016-05-22 MED ORDER — ALBUTEROL SULFATE HFA 108 (90 BASE) MCG/ACT IN AERS: 2.0000 | INHALATION_SPRAY | Freq: Four times a day (QID) | RESPIRATORY_TRACT | 1 refills | Status: DC | PRN

## 2016-05-22 NOTE — Progress Notes (Signed)
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 Refill albuterol and flexeril .  Supportive measures discussed. Follow up prn not improving   Nasal saline products bid and prn
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
## 2016-08-15 ENCOUNTER — Ambulatory Visit: Payer: Self-pay | Admitting: Physician Assistant

## 2016-08-15 ENCOUNTER — Encounter: Payer: Self-pay | Admitting: Physician Assistant

## 2016-08-15 VITALS — BP 120/80 | HR 74 | Temp 98.4°F

## 2016-08-15 DIAGNOSIS — L237 Allergic contact dermatitis due to plants, except food: Secondary | ICD-10-CM

## 2016-08-15 MED ORDER — PREDNISONE 10 MG (48) PO TBPK: ORAL_TABLET | ORAL | 0 refills | Status: DC

## 2016-08-15 MED ORDER — DEXAMETHASONE SODIUM PHOSPHATE 10 MG/ML IJ SOLN
10.0000 mg | Freq: Once | INTRAMUSCULAR | Status: AC
  Administered 2016-08-15: 10 mg via INTRAMUSCULAR

## 2016-08-15 NOTE — Progress Notes (Signed)
S: c/o itchy rash on hands, arms, legs, was outside in yard and then broke out, sx for few days, tried multiple otc meds without relief, denies fever/chills, had a spot last week that went away on its own, now has it all over his scalp, neck, shoulders  O: vitals wnl, nad, lungs c t a, cv rrr, skin with small raised red areas some with streaks/blisters, no drainage, n/v intact  A: acute contact dermatitis  P: decadron  im, sterapred ds 12 d dose pack

## 2016-08-28 ENCOUNTER — Other Ambulatory Visit: Payer: Managed Care, Other (non HMO)

## 2016-08-28 ENCOUNTER — Encounter: Payer: Self-pay | Admitting: Adult Health

## 2016-08-28 DIAGNOSIS — Z0000 Encounter for general adult medical examination without abnormal findings: Secondary | ICD-10-CM

## 2016-08-29 ENCOUNTER — Ambulatory Visit (INDEPENDENT_AMBULATORY_CARE_PROVIDER_SITE_OTHER): Payer: Managed Care, Other (non HMO) | Admitting: Adult Health

## 2016-08-29 ENCOUNTER — Encounter: Payer: Self-pay | Admitting: Adult Health

## 2016-08-29 DIAGNOSIS — R030 Elevated blood-pressure reading, without diagnosis of hypertension: Secondary | ICD-10-CM | POA: Diagnosis not present

## 2016-08-29 DIAGNOSIS — Z0000 Encounter for general adult medical examination without abnormal findings: Secondary | ICD-10-CM

## 2016-08-29 LAB — CBC WITH DIFFERENTIAL/PLATELET
BASOS: 1 %
Basophils Absolute: 0 10*3/uL (ref 0.0–0.2)
EOS (ABSOLUTE): 0.2 10*3/uL (ref 0.0–0.4)
Eos: 4 %
Hematocrit: 42.5 % (ref 37.5–51.0)
Hemoglobin: 14.3 g/dL (ref 13.0–17.7)
Immature Grans (Abs): 0 10*3/uL (ref 0.0–0.1)
Immature Granulocytes: 0 %
LYMPHS ABS: 2.8 10*3/uL (ref 0.7–3.1)
Lymphs: 43 %
MCH: 29.2 pg (ref 26.6–33.0)
MCHC: 33.6 g/dL (ref 31.5–35.7)
MCV: 87 fL (ref 79–97)
MONOS ABS: 0.4 10*3/uL (ref 0.1–0.9)
Monocytes: 7 %
NEUTROS ABS: 3 10*3/uL (ref 1.4–7.0)
Neutrophils: 45 %
Platelets: 234 10*3/uL (ref 150–379)
RBC: 4.89 x10E6/uL (ref 4.14–5.80)
RDW: 15.5 % — ABNORMAL HIGH (ref 12.3–15.4)
WBC: 6.5 10*3/uL (ref 3.4–10.8)

## 2016-08-29 LAB — COMPREHENSIVE METABOLIC PANEL
ALBUMIN: 4.7 g/dL (ref 3.5–5.5)
ALK PHOS: 28 IU/L — AB (ref 39–117)
ALT: 31 IU/L (ref 0–44)
AST: 22 IU/L (ref 0–40)
Albumin/Globulin Ratio: 2.6 — ABNORMAL HIGH (ref 1.2–2.2)
BUN/Creatinine Ratio: 13 (ref 9–20)
BUN: 11 mg/dL (ref 6–20)
Bilirubin Total: 0.4 mg/dL (ref 0.0–1.2)
CALCIUM: 9.2 mg/dL (ref 8.7–10.2)
CO2: 26 mmol/L (ref 18–29)
CREATININE: 0.85 mg/dL (ref 0.76–1.27)
Chloride: 99 mmol/L (ref 96–106)
GFR calc Af Amer: 130 mL/min/{1.73_m2} (ref >59)
GFR, EST NON AFRICAN AMERICAN: 112 mL/min/{1.73_m2} (ref >59)
GLOBULIN, TOTAL: 1.8 g/dL (ref 1.5–4.5)
GLUCOSE: 85 mg/dL (ref 65–99)
Potassium: 4.2 mmol/L (ref 3.5–5.2)
SODIUM: 142 mmol/L (ref 134–144)
Total Protein: 6.5 g/dL (ref 6.0–8.5)

## 2016-08-29 LAB — HEMOGLOBIN A1C
Est. average glucose Bld gHb Est-mCnc: 105 mg/dL
HEMOGLOBIN A1C: 5.3 % (ref 4.8–5.6)

## 2016-08-29 LAB — LIPID PANEL
CHOL/HDL RATIO: 3 ratio (ref 0.0–5.0)
CHOLESTEROL TOTAL: 124 mg/dL (ref 100–199)
HDL: 41 mg/dL (ref >39)
LDL CALC: 74 mg/dL (ref 0–99)
TRIGLYCERIDES: 43 mg/dL (ref 0–149)
VLDL CHOLESTEROL CAL: 9 mg/dL (ref 5–40)

## 2016-08-29 LAB — TSH: TSH: 3.52 u[IU]/mL (ref 0.450–4.500)

## 2016-08-29 LAB — VITAMIN D 25 HYDROXY (VIT D DEFICIENCY, FRACTURES): Vit D, 25-Hydroxy: 47.3 ng/mL (ref 30.0–100.0)

## 2016-08-29 MED ORDER — PREDNISONE 10 MG (48) PO TBPK: ORAL_TABLET | ORAL | 0 refills | Status: DC

## 2016-08-29 MED ORDER — CYCLOBENZAPRINE HCL 10 MG PO TABS: 10.0000 mg | ORAL_TABLET | Freq: Three times a day (TID) | ORAL | 2 refills | Status: DC | PRN

## 2016-08-29 MED ORDER — ALBUTEROL SULFATE HFA 108 (90 BASE) MCG/ACT IN AERS: 2.0000 | INHALATION_SPRAY | Freq: Four times a day (QID) | RESPIRATORY_TRACT | 2 refills | Status: DC | PRN

## 2016-08-29 NOTE — Assessment & Plan Note (Signed)
Started ketogenic diet 04/22/16-has lost 30lbs and labs look GREAT! Goal wt 215. KEEP IT UP!

## 2016-08-29 NOTE — Assessment & Plan Note (Signed)
Continue weight loss and reduce Na+

## 2016-08-29 NOTE — Assessment & Plan Note (Signed)
Continue diet and regular exercise. 30lb weight loss since Jan 2018. Immunizations UTD. Denies acute issues at present. Annual follow-up, sooner if needed.

## 2016-08-29 NOTE — Patient Instructions (Signed)
Heart-Healthy Eating Plan Many factors influence your heart health, including eating and exercise habits. Heart (coronary) risk increases with abnormal blood fat (lipid) levels. Heart-healthy meal planning includes limiting unhealthy fats, increasing healthy fats, and making other small dietary changes. This includes maintaining a healthy body weight to help keep lipid levels within a normal range. What is my plan? Your health care provider recommends that you:  Get no more than _________% of the total calories in your daily diet from fat.  Limit your intake of saturated fat to less than _________% of your total calories each day.  Limit the amount of cholesterol in your diet to less than _________ mg per day. What types of fat should I choose?  Choose healthy fats more often. Choose monounsaturated and polyunsaturated fats, such as olive oil and canola oil, flaxseeds, walnuts, almonds, and seeds.  Eat more omega-3 fats. Good choices include salmon, mackerel, sardines, tuna, flaxseed oil, and ground flaxseeds. Aim to eat fish at least two times each week.  Limit saturated fats. Saturated fats are primarily found in animal products, such as meats, butter, and cream. Plant sources of saturated fats include palm oil, palm kernel oil, and coconut oil.  Avoid foods with partially hydrogenated oils in them. These contain trans fats. Examples of foods that contain trans fats are stick margarine, some tub margarines, cookies, crackers, and other baked goods. What general guidelines do I need to follow?  Check food labels carefully to identify foods with trans fats or high amounts of saturated fat.  Fill one half of your plate with vegetables and green salads. Eat 4-5 servings of vegetables per day. A serving of vegetables equals 1 cup of raw leafy vegetables,  cup of raw or cooked cut-up vegetables, or  cup of vegetable juice.  Fill one fourth of your plate with whole grains. Look for the word  "whole" as the first word in the ingredient list.  Fill one fourth of your plate with lean protein foods.  Eat 4-5 servings of fruit per day. A serving of fruit equals one medium whole fruit,  cup of dried fruit,  cup of fresh, frozen, or canned fruit, or  cup of 100% fruit juice.  Eat more foods that contain soluble fiber. Examples of foods that contain this type of fiber are apples, broccoli, carrots, beans, peas, and barley. Aim to get 20-30 g of fiber per day.  Eat more home-cooked food and less restaurant, buffet, and fast food.  Limit or avoid alcohol.  Limit foods that are high in starch and sugar.  Avoid fried foods.  Cook foods by using methods other than frying. Baking, boiling, grilling, and broiling are all great options. Other fat-reducing suggestions include:  Removing the skin from poultry.  Removing all visible fats from meats.  Skimming the fat off of stews, soups, and gravies before serving them.  Steaming vegetables in water or broth.  Lose weight if you are overweight. Losing just 5-10% of your initial body weight can help your overall health and prevent diseases such as diabetes and heart disease.  Increase your consumption of nuts, legumes, and seeds to 4-5 servings per week. One serving of dried beans or legumes equals  cup after being cooked, one serving of nuts equals 1 ounces, and one serving of seeds equals  ounce or 1 tablespoon.  You may need to monitor your salt (sodium) intake, especially if you have high blood pressure. Talk with your health care provider or dietitian to get more  information about reducing sodium. What foods can I eat? Grains   Breads, including [place], white, pita, wheat, raisin, rye, oatmeal, and [place]. Tortillas that are neither fried nor made with lard or trans fat. Low-fat rolls, including hotdog and hamburger buns and English muffins. Biscuits. Muffins. Waffles. Pancakes. Light popcorn. Whole-grain cereals. Flatbread.  Melba toast. Pretzels. Breadsticks. Rusks. Low-fat snacks and crackers, including oyster, saltine, matzo, graham, animal, and rye. Rice and pasta, including [place] rice and those that are made with whole wheat. Vegetables  All vegetables. Fruits  All fruits, but limit coconut. Meats and Other Protein Sources  Lean, well-trimmed beef, veal, pork, and lamb. Chicken and [place] without skin. All fish and shellfish. Wild duck, rabbit, pheasant, and venison. Egg whites or low-cholesterol egg substitutes. Dried beans, peas, lentils, and tofu.Seeds and most nuts. Dairy  Low-fat or nonfat cheeses, including ricotta, string, and mozzarella. Skim or 1% milk that is liquid, powdered, or evaporated. Buttermilk that is made with low-fat milk. Nonfat or low-fat yogurt. Beverages  Mineral water. Diet carbonated beverages. Sweets and Desserts  Sherbets and fruit ices. Honey, jam, marmalade, jelly, and syrups. Meringues and gelatins. Pure sugar candy, such as hard candy, jelly beans, gumdrops, mints, marshmallows, and small amounts of dark chocolate. [employer] Eat all sweets and desserts in moderation. Fats and Oils  Nonhydrogenated (trans-free) margarines. Vegetable oils, including soybean, sesame, sunflower, olive, peanut, safflower, corn, canola, and cottonseed. Salad dressings or mayonnaise that are made with a vegetable oil. Limit added fats and oils that you use for cooking, baking, salads, and as spreads. Other  Cocoa powder. Coffee and tea. All seasonings and condiments. The items listed above may not be a complete list of recommended foods or beverages. Contact your dietitian for more options.  What foods are not recommended? Grains  Breads that are made with saturated or trans fats, oils, or whole milk. Croissants. Butter rolls. Cheese breads. Sweet rolls. Donuts. Buttered popcorn. Chow mein noodles. High-fat crackers, such as cheese or butter crackers. Meats and Other Protein Sources  Fatty  meats, such as hotdogs, short ribs, sausage, spareribs, bacon, ribeye roast or steak, and mutton. High-fat deli meats, such as salami and bologna. Caviar. Domestic duck and goose. Organ meats, such as kidney, liver, sweetbreads, brains, gizzard, chitterlings, and heart. Dairy  Cream, sour cream, cream cheese, and creamed cottage cheese. Whole milk cheeses, including blue (bleu), Monterey Jack, [place], [place], American, [place], Swiss, [place], [place], and [place]. Whole or 2% milk that is liquid, evaporated, or condensed. Whole buttermilk. Cream sauce or high-fat cheese sauce. Yogurt that is made from whole milk. Beverages  Regular sodas and drinks with added sugar. Sweets and Desserts  Frosting. Pudding. Cookies. Cakes other than angel food cake. Candy that has milk chocolate or white chocolate, hydrogenated fat, butter, coconut, or unknown ingredients. Buttered syrups. Full-fat ice cream or ice cream drinks. Fats and Oils  Gravy that has suet, meat fat, or shortening. Cocoa butter, hydrogenated oils, palm oil, coconut oil, palm kernel oil. These can often be found in baked products, candy, fried foods, nondairy creamers, and whipped toppings. Solid fats and shortenings, including bacon fat, salt pork, lard, and butter. Nondairy cream substitutes, such as coffee creamers and sour cream substitutes. Salad dressings that are made of unknown oils, cheese, or sour cream. The items listed above may not be a complete list of foods and beverages to avoid. Contact your dietitian for more information.  This information is not intended to replace advice given to you by  your health care provider. Make sure you discuss any questions you have with your health care provider. Document Released: 01/16/2008 Document Revised: 10/27/2015 Document Reviewed: 09/30/2013 Elsevier Interactive Patient Education  2017 Elsevier Inc.  GREAT JOB on weight loss. Labs look great! Annual follow-up, sooner if needed.

## 2016-08-29 NOTE — Progress Notes (Signed)
Subjective:    Patient ID: Sean Hahn, male    DOB: November 18, 1980, 36 y.o.   MRN: [id number]  HPI:  Sean Hahn here for CPE. 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   Ketogenic diet started Jan 1/18-weight loss of 30.  Goal wt is 66" Married with 2 yeard old son.  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 Cyclobenzaprine 10mg  PRN lumbago r/t to EMS job. Seasonal allergies treated with OTC Antihistamine and albuterol HFA inhaler.   Severe skin reaction to poison ivy/sumac/oak-treated with PRN Pred Doe Pak. Patient Care Team    Relationship Specialty Notifications Start End  Sean Lot, DO PCP - General Family Medicine  09/19/15     Patient Active Problem List   Diagnosis Date Noted  . Health care maintenance 08/29/2016  . Obese 09/24/2015  . Pre-hypertension 09/21/2015     Past Medical History:  Diagnosis Date  . Atrial fibrillation (HCC)   . Obese 09/24/2015  . Pre-hypertension 09/21/2015     Past Surgical History:  Procedure Laterality Date  . ANTERIOR CRUCIATE LIGAMENT REPAIR Left   . KNEE SURGERY Left      Family History  Problem Relation Age of Onset  . Hypertension Mother   . Hypertension Father      History  Drug Use No     History  Alcohol Use  . 3.0 - 6.0 oz/week  . 5 - 10 Cans of beer per week     History  Smoking Status  . Former Smoker  . Quit date: 04/22/2005  Smokeless Tobacco  . Never Used     Outpatient Encounter Prescriptions as of 08/29/2016  Medication Sig  . albuterol (PROVENTIL HFA;VENTOLIN HFA) 108 (90 Base) MCG/ACT inhaler Inhale 2 puffs into the lungs every 6 (six) hours as needed for wheezing or shortness of breath.  . cyclobenzaprine (FLEXERIL) 10 MG tablet Take 1 tablet (10 mg total) by mouth 3 (three) times daily as needed for muscle spasms.  . [DISCONTINUED] albuterol (PROVENTIL HFA;VENTOLIN HFA) 108 (90 Base) MCG/ACT inhaler Inhale 2 puffs into the lungs every 6 (six) hours as needed for  wheezing or shortness of breath.  . [DISCONTINUED] cyclobenzaprine (FLEXERIL) 10 MG tablet Take 1 tablet (10 mg total) by mouth 3 (three) times daily as needed for muscle spasms.  . predniSONE (STERAPRED UNI-PAK 48 TAB) 10 MG (48) TBPK tablet Use as directed  . [DISCONTINUED] predniSONE (STERAPRED UNI-PAK 48 TAB) 10 MG (48) TBPK tablet Use as directed (Patient not taking: Reported on 08/29/2016)   No facility-administered encounter medications on file as of 08/29/2016.     Allergies: Aloe; Keflex [cephalexin]; and Penicillin g  Body mass index is 29.58 kg/m.  Blood pressure (!) 146/90, pulse 79, temperature 98.3 F (36.8 C), height 6\' 4"  (1.93 m), weight 243 lb (110.2 kg), SpO2 98 %.   Review of Systems  Constitutional: Negative for activity change, appetite change, chills, diaphoresis, fatigue, fever and unexpected weight change.  HENT: Negative for congestion.   Eyes: Negative for visual disturbance.  Respiratory: Negative for cough, chest tightness, shortness of breath, wheezing and stridor.   Cardiovascular: Negative for chest pain, palpitations and leg swelling.  Gastrointestinal: Negative for abdominal distention, abdominal pain, blood in stool, constipation, diarrhea, nausea and vomiting.  Endocrine: Negative for cold intolerance, heat intolerance, polydipsia, polyphagia and polyuria.  Genitourinary: Negative for difficulty urinating, flank pain and hematuria.  Musculoskeletal: Positive for back pain. Negative for arthralgias, gait problem, joint swelling, myalgias, neck pain and neck  stiffness.       Intermittent lumbago r/t EMS job.  No current pain at present.  Skin: Negative for color change, pallor, rash and wound.  Neurological: Negative for dizziness, tremors, weakness, light-headedness and headaches.  Hematological: Does not bruise/bleed easily.  Psychiatric/Behavioral: Negative for agitation, dysphoric mood and sleep disturbance. The patient is not nervous/anxious.         Objective:   Physical Exam  Constitutional: He is oriented to person, place, and time. He appears well-developed and well-nourished. No distress.  HENT:  Head: Normocephalic and atraumatic.  Right Ear: Hearing, tympanic membrane, external ear and ear canal normal. No decreased hearing is noted.  Left Ear: Tympanic membrane, external ear and ear canal normal. No decreased hearing is noted.  Nose: Nose normal. Right sinus exhibits no maxillary sinus tenderness and no frontal sinus tenderness. Left sinus exhibits no maxillary sinus tenderness and no frontal sinus tenderness.  Mouth/Throat: Uvula is midline.  Eyes: Conjunctivae are normal. Pupils are equal, round, and reactive to light.  Neck: Normal range of motion. Neck supple.  Cardiovascular: Normal rate, regular rhythm, normal heart sounds and intact distal pulses.   No murmur heard. Pulmonary/Chest: Effort normal and breath sounds normal. No respiratory distress. He has no wheezes. He has no rales. He exhibits no tenderness.  Abdominal: Soft. Bowel sounds are normal. He exhibits no distension and no mass. There is no tenderness. There is no rebound and no guarding. Hernia confirmed negative in the right inguinal area and confirmed negative in the left inguinal area.  Genitourinary: Rectum normal, testes normal and penis normal. Circumcised. No penile tenderness.  Musculoskeletal: Normal range of motion.  Lymphadenopathy:    He has no cervical adenopathy.       Right: No inguinal adenopathy present.       Left: No inguinal adenopathy present.  Neurological: He is alert and oriented to person, place, and time. Coordination normal.  Skin: Skin is warm and dry. No rash noted. He is not diaphoretic. No erythema. No pallor.  Psychiatric: He has a normal mood and affect. His behavior is normal. Judgment and thought content normal.  Nursing note and vitals reviewed.         Assessment & Plan:   1. Pre-hypertension   2. Health  care maintenance     Pre-hypertension Continue weight loss and reduce Na+  Obese Started ketogenic diet 04/22/16-has lost 30lbs and labs look GREAT! Goal wt 215. KEEP IT UP!  Health care maintenance Continue diet and regular exercise. 30lb weight loss since Jan 2018. Immunizations UTD. Denies acute issues at present. Annual follow-up, sooner if needed.    FOLLOW-UP:  Return in about 1 year (around 08/29/2017) for Regular Follow Up.

## 2016-12-03 ENCOUNTER — Telehealth: Payer: Self-pay | Admitting: Adult Health

## 2017-07-09 NOTE — Progress Notes (Addendum)
Subjective:    Patient ID: Sean GrillsMatthew Darroch, male    DOB: [birth year], 37 y.o.   MRN: [id number]  HPI:  Mr. Malvin Johnsotter presents with low back pain with muscle spasm that has been worsening for the last two days.  He has been performing rigorous yard for the > 1 week, getting the house ready to be placed on market for sale (his is [place] He denies acute injury/accident prior to onset of sx's. He reports pain/spams more sig on L side. L lower back pain is 2/10 at rest, 6/10 with movement, pain is described as tightness/soreness. R lower back pain is 1/10 at rest, 5/10 with movement. He denies bowel/bladder dyfunction. He denies numbness/tingling in lower extremities. He does report intermittent upper extremity numbness/tingling that has been increasing the last few weeks.  Upper ext numbness/tingling has been present for years. He played Ruby for number of years. 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 Acute bilateral low back pain without sciatica 07/10/2017  . Spasm of muscle of lower back 07/10/2017  . Health care maintenance 08/29/2016  . Obese 09/24/2015  . Pre-hypertension 09/21/2015     Past Medical History:  Diagnosis Date  . Atrial fibrillation (HCC)   . Obese 09/24/2015  . Pre-hypertension 09/21/2015     Past Surgical History:  Procedure Laterality Date  . ANTERIOR CRUCIATE LIGAMENT REPAIR Left   . KNEE SURGERY Left      Family History  Problem Relation Age of Onset  . Hypertension Mother   . Hypertension Father      Social History   Substance and Sexual Activity  Drug Use No     Social History   Substance and Sexual  Activity  Alcohol Use Yes  . Alcohol/week: 3.0 - 6.0 oz  . Types: 5 - 10 Cans of beer per week     Social History   Tobacco Use  Smoking Status Former Smoker  . Last attempt to quit: 04/22/2005  . Years since quitting: 12.2  Smokeless Tobacco Never Used     Outpatient Encounter Medications as of 07/10/2017  Medication Sig  . albuterol (PROVENTIL HFA;VENTOLIN HFA) 108 (90 Base) MCG/ACT inhaler Inhale 2 puffs into the lungs every 6 (six) hours as needed for wheezing or shortness of breath.  . cyclobenzaprine (FLEXERIL) 10 MG tablet Take 1 tablet (10 mg total) by mouth 3 (three) times daily as needed for muscle spasms.  Marland Kitchen. oxyCODONE-acetaminophen (PERCOCET) 10-325 MG tablet Take 1 tablet by mouth every 8 (eight) hours as needed for pain.  . predniSONE (DELTASONE) 20 MG tablet 1 tab every 12 hrs for 3 days then 1 tab daily for 3 days  . [DISCONTINUED] cyclobenzaprine (FLEXERIL) 10 MG tablet Take 1 tablet (10 mg total) by mouth 3 (three) times daily as needed for muscle spasms.  . [DISCONTINUED] predniSONE (STERAPRED UNI-PAK 48 TAB) 10 MG (48) TBPK tablet Use as directed   No facility-administered encounter medications on file as of 07/10/2017.     Allergies: Aloe; Keflex [cephalexin]; and Penicillin g  Body mass index is 32.12 kg/m.  Blood pressure 132/80, pulse 69, height 6\' 4"  (1.93 m), weight  263 lb 14.4 oz (119.7 kg), SpO2 96 %.      Review of Systems  Constitutional: Positive for fatigue. Negative for activity change, appetite change, chills, diaphoresis, fever and unexpected weight change.  Eyes: Negative for visual disturbance.  Respiratory: Negative for cough, chest tightness, shortness of breath, wheezing and stridor.   Cardiovascular: Negative for chest pain, palpitations and leg swelling.  Gastrointestinal: Negative for abdominal distention, abdominal pain, blood in stool, constipation, diarrhea, nausea and vomiting.  Genitourinary: Negative for difficulty urinating  and flank pain.  Musculoskeletal: Positive for arthralgias, back pain, gait problem and myalgias. Negative for joint swelling, neck pain and neck stiffness.  Skin: Positive for color change.       R hand ecchymosis r/t needle aspiration to remove fluid from atop R hand.    Neurological: Negative for dizziness and headaches.  Hematological: Does not bruise/bleed easily.  Psychiatric/Behavioral: Positive for sleep disturbance.       Objective:   Physical Exam  Constitutional: He is oriented to person, place, and time. He appears well-developed and well-nourished. No distress.  HENT:  Head: Normocephalic and atraumatic.  Right Ear: External ear normal.  Left Ear: External ear normal.  Cardiovascular: Normal rate, regular rhythm, normal heart sounds and intact distal pulses.  No murmur heard. Pulmonary/Chest: Effort normal and breath sounds normal. No respiratory distress. He has no wheezes. He has no rales. He exhibits no tenderness.  Musculoskeletal: He exhibits tenderness.       Right hip: Normal.       Left hip: Normal.       Thoracic back: Normal.       Lumbar back: He exhibits decreased range of motion, tenderness and spasm.  Limited L lateral rotation Limited extension  Neurological: He is alert and oriented to person, place, and time. Coordination abnormal.  Skin: Skin is warm and dry. No rash noted. He is not diaphoretic. No erythema. No pallor.  Psychiatric: He has a normal mood and affect. His behavior is normal. Judgment and thought content normal.  Nursing note and vitals reviewed.         Assessment & Plan:   1. Acute bilateral low back pain without sciatica   2. Spasm of muscle of lower back     Acute bilateral low back pain without sciatica [place] Controlled Substance Database reviewed- no contraindications noted. Please use Cyclobenzaprine and Percocet as needed. Please take Prednisone taper as directed. Apply ice to lower back for 20 mins,  several times daily. If symptoms persist for > 2 weeks, please make follow-up appt. [place]  Spasm of muscle of lower back Please use Cyclobenzaprine and Percocet as needed. Please take Prednisone taper as directed. Apply ice to lower back for 20 mins, several times daily. If symptoms persist for > 2 weeks, please make follow-up appt. Good [place] with [place] and [place]!    FOLLOW-UP:  Return if symptoms worsen or fail to improve.

## 2017-07-10 ENCOUNTER — Encounter: Payer: Self-pay | Admitting: Adult Health

## 2017-07-10 ENCOUNTER — Ambulatory Visit: Payer: Managed Care, Other (non HMO) | Admitting: Adult Health

## 2017-07-10 VITALS — BP 132/80 | HR 69 | Ht 76.0 in | Wt 263.9 lb

## 2017-07-10 DIAGNOSIS — M545 Low back pain, unspecified: Secondary | ICD-10-CM | POA: Insufficient documentation

## 2017-07-10 DIAGNOSIS — M62830 Muscle spasm of back: Secondary | ICD-10-CM | POA: Diagnosis not present

## 2017-07-10 MED ORDER — CYCLOBENZAPRINE HCL 10 MG PO TABS: 10.0000 mg | ORAL_TABLET | Freq: Three times a day (TID) | ORAL | 2 refills | Status: DC | PRN

## 2017-07-10 MED ORDER — PREDNISONE 20 MG PO TABS: ORAL_TABLET | ORAL | 0 refills | Status: AC

## 2017-07-10 MED ORDER — OXYCODONE-ACETAMINOPHEN 10-325 MG PO TABS: 1.0000 | ORAL_TABLET | Freq: Three times a day (TID) | ORAL | 0 refills | Status: AC | PRN

## 2017-07-10 NOTE — Assessment & Plan Note (Signed)
Please use Cyclobenzaprine and Percocet as needed. Please take Prednisone taper as directed. Apply ice to lower back for 20 mins, several times daily. If symptoms persist for > 2 weeks, please make follow-up appt. Good [place] and [place]

## 2017-07-10 NOTE — Assessment & Plan Note (Addendum)
[place] Controlled Substance Database reviewed- no contraindications noted. Please use Cyclobenzaprine and Percocet as needed. Please take Prednisone taper as directed. Apply ice to lower back for 20 mins, several times daily. If symptoms persist for > 2 weeks, please make follow-up appt. Good [place] with [place] and [place]!

## 2017-07-10 NOTE — Patient Instructions (Signed)
Low Back Sprain Rehab Ask your health care provider which exercises are safe for you. Do exercises exactly as told by your health care provider and adjust them as directed. It is normal to feel mild stretching, pulling, tightness, or discomfort as you do these exercises, but you should stop right away if you feel sudden pain or your pain gets worse. Do not begin these exercises until told by your health care provider. Stretching and range of motion exercises These exercises warm up your muscles and joints and improve the movement and flexibility of your back. These exercises also help to relieve pain, numbness, and tingling. Exercise A: Lumbar rotation  1. Lie on your back on a firm surface and bend your knees. 2. Straighten your arms out to your sides so each arm forms an "L" shape with a side of your body (a 90 degree angle). 3. Slowly move both of your knees to one side of your body until you feel a stretch in your lower back. Try not to let your shoulders move off of the floor. 4. Hold for __________ seconds. 5. Tense your abdominal muscles and slowly move your knees back to the starting position. 6. Repeat this exercise on the other side of your body. Repeat __________ times. Complete this exercise __________ times a day. Exercise B: Prone extension on elbows  1. Lie on your abdomen on a firm surface. 2. Prop yourself up on your elbows. 3. Use your arms to help lift your chest up until you feel a gentle stretch in your abdomen and your lower back. ? This will place some of your body weight on your elbows. If this is uncomfortable, try stacking pillows under your chest. ? Your hips should stay down, against the surface that you are lying on. Keep your hip and back muscles relaxed. 4. Hold for __________ seconds. 5. Slowly relax your upper body and return to the starting position. Repeat __________ times. Complete this exercise __________ times a day. Strengthening exercises These  exercises build strength and endurance in your back. Endurance is the ability to use your muscles for a long time, even after they get tired. Exercise C: Pelvic tilt 1. Lie on your back on a firm surface. Bend your knees and keep your feet flat. 2. Tense your abdominal muscles. Tip your pelvis up toward the ceiling and flatten your lower back into the floor. ? To help with this exercise, you may place a small towel under your lower back and try to push your back into the towel. 3. Hold for __________ seconds. 4. Let your muscles relax completely before you repeat this exercise. Repeat __________ times. Complete this exercise __________ times a day. Exercise D: Alternating arm and leg raises  1. Get on your hands and knees on a firm surface. If you are on a hard floor, you may want to use padding to cushion your knees, such as an exercise mat. 2. Line up your arms and legs. Your hands should be below your shoulders, and your knees should be below your hips. 3. Lift your left leg behind you. At the same time, raise your right arm and straighten it in front of you. ? Do not lift your leg higher than your hip. ? Do not lift your arm higher than your shoulder. ? Keep your abdominal and back muscles tight. ? Keep your hips facing the ground. ? Do not arch your back. ? Keep your balance carefully, and do not hold your breath. 4. Hold  for __________ seconds. 5. Slowly return to the starting position and repeat with your right leg and your left arm. Repeat __________ times. Complete this exercise __________ times a day. Exercise E: Abdominal set with straight leg raise  1. Lie on your back on a firm surface. 2. Bend one of your knees and keep your other leg straight. 3. Tense your abdominal muscles and lift your straight leg up, 4-6 inches (10-15 cm) off the ground. 4. Keep your abdominal muscles tight and hold for __________ seconds. ? Do not hold your breath. ? Do not arch your back. Keep it  flat against the ground. 5. Keep your abdominal muscles tense as you slowly lower your leg back to the starting position. 6. Repeat with your other leg. Repeat __________ times. Complete this exercise __________ times a day. Posture and body mechanics  Body mechanics refers to the movements and positions of your body while you do your daily activities. Posture is part of body mechanics. Good posture and healthy body mechanics can help to relieve stress in your body's tissues and joints. Good posture means that your spine is in its natural S-curve position (your spine is neutral), your shoulders are pulled back slightly, and your head is not tipped forward. The following are general guidelines for applying improved posture and body mechanics to your everyday activities. Standing   When standing, keep your spine neutral and your feet about hip-width apart. Keep a slight bend in your knees. Your ears, shoulders, and hips should line up.  When you do a task in which you stand in one place for a long time, place one foot up on a stable object that is 2-4 inches (5-10 cm) high, such as a footstool. This helps keep your spine neutral. Sitting   When sitting, keep your spine neutral and keep your feet flat on the floor. Use a footrest, if necessary, and keep your thighs parallel to the floor. Avoid rounding your shoulders, and avoid tilting your head forward.  When working at a desk or a computer, keep your desk at a height where your hands are slightly lower than your elbows. Slide your chair under your desk so you are close enough to maintain good posture.  When working at a computer, place your monitor at a height where you are looking straight ahead and you do not have to tilt your head forward or downward to look at the screen. Resting   When lying down and resting, avoid positions that are most painful for you.  If you have pain with activities such as sitting, bending, stooping, or squatting  (flexion-based activities), lie in a position in which your body does not bend very much. For example, avoid curling up on your side with your arms and knees near your chest (fetal position).  If you have pain with activities such as standing for a long time or reaching with your arms (extension-based activities), lie with your spine in a neutral position and bend your knees slightly. Try the following positions:  Lying on your side with a pillow between your knees.  Lying on your back with a pillow under your knees. Lifting   When lifting objects, keep your feet at least shoulder-width apart and tighten your abdominal muscles.  Bend your knees and hips and keep your spine neutral. It is important to lift using the strength of your legs, not your back. Do not lock your knees straight out.  Always ask for help to lift heavy or  awkward objects. This information is not intended to replace advice given to you by your health care provider. Make sure you discuss any questions you have with your health care provider. Document Released: 04/08/2005 Document Revised: 12/14/2015 Document Reviewed: 01/18/2015 Elsevier Interactive Patient Education  2018 [employer] Muscle Cramps and Spasms Muscle cramps and spasms are when muscles tighten by themselves. They usually get better within minutes. Muscle cramps are painful. They are usually stronger and last longer than muscle spasms. Muscle spasms may or may not be painful. They can last a few seconds or much longer. Follow these instructions at home:  Drink enough fluid to keep your pee (urine) clear or pale yellow.  Massage, stretch, and relax the muscle.  If directed, apply heat to tight or tense muscles as often as told by your doctor. Use the heat source that your doctor recommends. ? Place a towel between your skin and the heat source. ? Leave the heat on for 20-30 minutes. ? Take off the heat if your skin turns bright red. This is especially  important if you are unable to feel pain, heat, or cold. You may have a greater risk of getting burned.  If directed, put ice on the affected area. This may help if you are sore or have pain after a cramp or spasm. ? Put ice in a plastic bag. ? Place a towel between your skin and the bag. ? Leave the ice on for 20 minutes, 2-3 times a day.  Take over-the-counter and prescription medicines only as told by your doctor.  Pay attention to any changes in your symptoms. Contact a doctor if:  Your cramps or spasms get worse or happen more often.  Your cramps or spasms do not get better with time. This information is not intended to replace advice given to you by your health care provider. Make sure you discuss any questions you have with your health care provider. Document Released: 03/21/2008 Document Revised: 05/10/2015 Document Reviewed: 01/10/2015 Elsevier Interactive Patient Education  2018 [employer]   Please use Cyclobenzaprine and Percocet as needed. Please take Prednisone taper as directed. Apply ice to lower back for 20 mins, several times daily. If symptoms persist for > 2 weeks, please make follow-up appt. [place] FEEL [place]!

## 2017-10-13 ENCOUNTER — Other Ambulatory Visit: Payer: Self-pay | Admitting: Family Medicine

## 2017-10-13 MED ORDER — ALBUTEROL SULFATE HFA 108 (90 BASE) MCG/ACT IN AERS: 2.0000 | INHALATION_SPRAY | Freq: Four times a day (QID) | RESPIRATORY_TRACT | 2 refills | Status: DC | PRN

## 2017-10-13 MED ORDER — CYCLOBENZAPRINE HCL 10 MG PO TABS: 10.0000 mg | ORAL_TABLET | Freq: Three times a day (TID) | ORAL | 0 refills | Status: DC | PRN

## 2017-10-13 NOTE — Addendum Note (Signed)
Addended by: Stan HeadNELSON, Deshanae Lindo S on: 10/13/2017 04:18 PM   Modules accepted: Orders

## 2017-10-13 NOTE — Telephone Encounter (Signed)
Patient was instructed to call our office if he needed refill of meds, he is requesting a refill of his flexeril, percocet, and inhaler. If approved please send to [facility] [place] (p: [phone])

## 2017-10-13 NOTE — Telephone Encounter (Signed)
If he needs additional refills he will need OV

## 2017-10-14 MED ORDER — ALBUTEROL SULFATE HFA 108 (90 BASE) MCG/ACT IN AERS: 2.0000 | INHALATION_SPRAY | Freq: Four times a day (QID) | RESPIRATORY_TRACT | 2 refills | Status: DC | PRN

## 2017-10-14 MED ORDER — ALBUTEROL SULFATE HFA 108 (90 BASE) MCG/ACT IN AERS: 2.0000 | INHALATION_SPRAY | Freq: Four times a day (QID) | RESPIRATORY_TRACT | 2 refills | Status: AC | PRN

## 2017-10-14 MED ORDER — CYCLOBENZAPRINE HCL 10 MG PO TABS: 10.0000 mg | ORAL_TABLET | Freq: Three times a day (TID) | ORAL | 0 refills | Status: AC | PRN

## 2017-10-14 NOTE — Addendum Note (Signed)
Addended by: Stan HeadNELSON, TONYA S on: 10/14/2017 02:06 PM   Modules accepted: Orders

## 2017-10-14 NOTE — Telephone Encounter (Signed)
LVM for pt to return call to discuss.  T. Monifah Freehling, CMA 

## 2017-10-14 NOTE — Telephone Encounter (Signed)
Pt informed of need for OV.  However, pt states that [place] and will no longer be a patient of [facility].  Tiajuana Amass. Galen Russman, CMA
